# Patient Record
Sex: Female | Born: 1980 | Race: White | Hispanic: No | Marital: Single | Smoking: Current every day smoker
Health system: Southern US, Community
[De-identification: ages and names within clinical notes are randomized; demographics above are authoritative.]

## PROBLEM LIST (undated history)

## (undated) DIAGNOSIS — F988 Other specified behavioral and emotional disorders with onset usually occurring in childhood and adolescence: Secondary | ICD-10-CM

## (undated) HISTORY — PX: NO PAST SURGERIES: SHX2092

---

## 2001-10-24 ENCOUNTER — Other Ambulatory Visit: Admission: RE | Admit: 2001-10-24 | Discharge: 2001-10-24 | Payer: Self-pay | Admitting: *Deleted

## 2002-11-20 ENCOUNTER — Other Ambulatory Visit: Admission: RE | Admit: 2002-11-20 | Discharge: 2002-11-20 | Payer: Self-pay | Admitting: Obstetrics and Gynecology

## 2003-12-18 ENCOUNTER — Other Ambulatory Visit: Admission: RE | Admit: 2003-12-18 | Discharge: 2003-12-18 | Payer: Self-pay | Admitting: Obstetrics and Gynecology

## 2004-05-22 ENCOUNTER — Ambulatory Visit: Payer: Self-pay | Admitting: Pediatrics

## 2005-01-07 ENCOUNTER — Other Ambulatory Visit: Admission: RE | Admit: 2005-01-07 | Discharge: 2005-01-07 | Payer: Self-pay | Admitting: Obstetrics and Gynecology

## 2014-03-22 NOTE — L&D Delivery Note (Signed)
Patient was C/C/+2 and pushed for approx 60 minutes with epidural.   NSVD female infant, Apgars 8/9, weight pending.   The patient had a left vaginal laceration reparied with 2-0 vicryl rapide and a Left labial evulsion repaired with 2-0 vicryl rapide. Fundus was firm. EBL was expected amount. Placenta was delivered intact. Vagina was clear.  Baby was vigorous and doing skin to skin with mother.  Philip Aspen

## 2014-05-29 LAB — OB RESULTS CONSOLE ABO/RH: RH Type: POSITIVE

## 2014-05-29 LAB — OB RESULTS CONSOLE GC/CHLAMYDIA
Chlamydia: NEGATIVE
GC PROBE AMP, GENITAL: NEGATIVE

## 2014-05-29 LAB — OB RESULTS CONSOLE RUBELLA ANTIBODY, IGM: RUBELLA: IMMUNE

## 2014-05-29 LAB — OB RESULTS CONSOLE HEPATITIS B SURFACE ANTIGEN: Hepatitis B Surface Ag: NEGATIVE

## 2014-05-29 LAB — OB RESULTS CONSOLE RPR: RPR: NONREACTIVE

## 2014-05-29 LAB — OB RESULTS CONSOLE HIV ANTIBODY (ROUTINE TESTING): HIV: NONREACTIVE

## 2014-07-14 ENCOUNTER — Emergency Department
Admit: 2014-07-14 | Disposition: A | Discharge status: Home / Self Care | Payer: Self-pay | Admitting: Physician Assistant

## 2014-07-24 ENCOUNTER — Telehealth: Payer: Self-pay | Admitting: Emergency Medicine

## 2014-07-24 NOTE — ED Notes (Signed)
Company rep from [employer] called asking for clarification of return to work note.  There is no time limit for no use of injured limb.  i told her that it would be 5 days restriction and the patient should be on crutches.  She says the patient was not on crutches when she came to office.  i explained that the provider is not here right now. She says she is going to have worker comp ins investigate and will send the employee home.

## 2014-11-15 LAB — OB RESULTS CONSOLE GBS: STREP GROUP B AG: POSITIVE

## 2014-12-06 ENCOUNTER — Inpatient Hospital Stay (HOSPITAL_COMMUNITY): Payer: No Typology Code available for payment source | Admitting: Anesthesiology

## 2014-12-06 ENCOUNTER — Inpatient Hospital Stay (HOSPITAL_COMMUNITY)
Admission: AD | Admit: 2014-12-06 | Discharge: 2014-12-09 | DRG: 774 | Disposition: A | Discharge status: Home / Self Care | Payer: No Typology Code available for payment source | Source: Ambulatory Visit | Attending: Obstetrics and Gynecology | Admitting: Obstetrics and Gynecology

## 2014-12-06 ENCOUNTER — Encounter (HOSPITAL_COMMUNITY): Payer: Self-pay | Admitting: *Deleted

## 2014-12-06 DIAGNOSIS — Z6832 Body mass index (BMI) 32.0-32.9, adult: Secondary | ICD-10-CM

## 2014-12-06 DIAGNOSIS — O1493 Unspecified pre-eclampsia, third trimester: Principal | ICD-10-CM | POA: Diagnosis present

## 2014-12-06 DIAGNOSIS — O1092 Unspecified pre-existing hypertension complicating childbirth: Secondary | ICD-10-CM | POA: Diagnosis present

## 2014-12-06 DIAGNOSIS — E669 Obesity, unspecified: Secondary | ICD-10-CM | POA: Diagnosis present

## 2014-12-06 DIAGNOSIS — O99214 Obesity complicating childbirth: Secondary | ICD-10-CM | POA: Diagnosis present

## 2014-12-06 DIAGNOSIS — Z3A39 39 weeks gestation of pregnancy: Secondary | ICD-10-CM | POA: Diagnosis not present

## 2014-12-06 HISTORY — DX: Other specified behavioral and emotional disorders with onset usually occurring in childhood and adolescence: F98.8

## 2014-12-06 LAB — COMPREHENSIVE METABOLIC PANEL
ALK PHOS: 146 U/L — AB (ref 38–126)
ALT: 17 U/L (ref 14–54)
AST: 21 U/L (ref 15–41)
Albumin: 2.9 g/dL — ABNORMAL LOW (ref 3.5–5.0)
Anion gap: 10 (ref 5–15)
BUN: 15 mg/dL (ref 6–20)
CHLORIDE: 105 mmol/L (ref 101–111)
CO2: 21 mmol/L — AB (ref 22–32)
CREATININE: 1.07 mg/dL — AB (ref 0.44–1.00)
Calcium: 9.5 mg/dL (ref 8.9–10.3)
GFR calc Af Amer: >60 mL/min (ref >60)
Glucose, Bld: 107 mg/dL — ABNORMAL HIGH (ref 65–99)
Potassium: 3.6 mmol/L (ref 3.5–5.1)
SODIUM: 136 mmol/L (ref 135–145)
Total Bilirubin: 0.4 mg/dL (ref 0.3–1.2)
Total Protein: 6.4 g/dL — ABNORMAL LOW (ref 6.5–8.1)

## 2014-12-06 LAB — CBC
HCT: 33.2 % — ABNORMAL LOW (ref 36.0–46.0)
HEMATOCRIT: 30 % — AB (ref 36.0–46.0)
Hemoglobin: 11.2 g/dL — ABNORMAL LOW (ref 12.0–15.0)
Hemoglobin: 10.1 g/dL — ABNORMAL LOW (ref 12.0–15.0)
MCH: 30 pg (ref 26.0–34.0)
MCH: 30.1 pg (ref 26.0–34.0)
MCHC: 33.7 g/dL (ref 30.0–36.0)
MCHC: 33.7 g/dL (ref 30.0–36.0)
MCV: 89 fL (ref 78.0–100.0)
MCV: 89.6 fL (ref 78.0–100.0)
PLATELETS: 276 10*3/uL (ref 150–400)
Platelets: 230 10*3/uL (ref 150–400)
RBC: 3.73 MIL/uL — AB (ref 3.87–5.11)
RBC: 3.35 MIL/uL — AB (ref 3.87–5.11)
RDW: 14.3 % (ref 11.5–15.5)
RDW: 14.8 % (ref 11.5–15.5)
WBC: 17 10*3/uL — ABNORMAL HIGH (ref 4.0–10.5)
WBC: 21.7 10*3/uL — ABNORMAL HIGH (ref 4.0–10.5)

## 2014-12-06 LAB — MAGNESIUM
MAGNESIUM: 5.5 mg/dL — AB (ref 1.7–2.4)
MAGNESIUM: 6.4 mg/dL — AB (ref 1.7–2.4)
Magnesium: 1.6 mg/dL — ABNORMAL LOW (ref 1.7–2.4)
Magnesium: 3.8 mg/dL — ABNORMAL HIGH (ref 1.7–2.4)

## 2014-12-06 LAB — TYPE AND SCREEN
ABO/RH(D): B POS
Antibody Screen: NEGATIVE

## 2014-12-06 LAB — RPR: RPR Ser Ql: NONREACTIVE

## 2014-12-06 LAB — PROTEIN / CREATININE RATIO, URINE
Creatinine, Urine: 68 mg/dL
PROTEIN CREATININE RATIO: 0.29 mg/mg{creat} — AB (ref 0.00–0.15)
Total Protein, Urine: 20 mg/dL

## 2014-12-06 LAB — URIC ACID: Uric Acid, Serum: 5.3 mg/dL (ref 2.3–6.6)

## 2014-12-06 LAB — ABO/RH: ABO/RH(D): B POS

## 2014-12-06 LAB — LACTATE DEHYDROGENASE: LDH: 157 U/L (ref 98–192)

## 2014-12-06 MED ORDER — OXYCODONE-ACETAMINOPHEN 5-325 MG PO TABS: 2.0000 | ORAL_TABLET | ORAL | Status: DC | PRN

## 2014-12-06 MED ORDER — PROMETHAZINE HCL 25 MG PO TABS
25.0000 mg | ORAL_TABLET | Freq: Once | ORAL | Status: AC
  Administered 2014-12-06: 25 mg via ORAL
  Filled 2014-12-06: qty 1

## 2014-12-06 MED ORDER — IBUPROFEN 600 MG PO TABS
600.0000 mg | ORAL_TABLET | Freq: Four times a day (QID) | ORAL | Status: DC
  Administered 2014-12-06 – 2014-12-09 (×12): 600 mg via ORAL
  Filled 2014-12-06 (×12): qty 1

## 2014-12-06 MED ORDER — LACTATED RINGERS IV SOLN
INTRAVENOUS | Status: DC
  Administered 2014-12-06 – 2014-12-07 (×4): via INTRAVENOUS

## 2014-12-06 MED ORDER — LIDOCAINE HCL (PF) 1 % IJ SOLN
INTRAMUSCULAR | Status: DC | PRN
  Administered 2014-12-06 (×2): 4 mL via EPIDURAL
  Administered 2014-12-06: 2 mL via EPIDURAL

## 2014-12-06 MED ORDER — LABETALOL HCL 5 MG/ML IV SOLN
20.0000 mg | INTRAVENOUS | Status: DC | PRN
  Administered 2014-12-06: 40 mg via INTRAVENOUS
  Administered 2014-12-06: 20 mg via INTRAVENOUS
  Filled 2014-12-06: qty 4
  Filled 2014-12-06: qty 16
  Filled 2014-12-06: qty 8

## 2014-12-06 MED ORDER — OXYCODONE-ACETAMINOPHEN 5-325 MG PO TABS: 1.0000 | ORAL_TABLET | ORAL | Status: DC | PRN

## 2014-12-06 MED ORDER — FLEET ENEMA 7-19 GM/118ML RE ENEM: 1.0000 | ENEMA | RECTAL | Status: DC | PRN

## 2014-12-06 MED ORDER — LACTATED RINGERS IV SOLN
INTRAVENOUS | Status: DC
  Administered 2014-12-06: 12:00:00 via INTRAUTERINE

## 2014-12-06 MED ORDER — LIDOCAINE HCL (PF) 1 % IJ SOLN: 30.0000 mL | INTRAMUSCULAR | Status: DC | PRN

## 2014-12-06 MED ORDER — TERBUTALINE SULFATE 1 MG/ML IJ SOLN: 0.2500 mg | Freq: Once | INTRAMUSCULAR | Status: DC | PRN

## 2014-12-06 MED ORDER — CITRIC ACID-SODIUM CITRATE 334-500 MG/5ML PO SOLN
30.0000 mL | ORAL | Status: DC | PRN
  Administered 2014-12-06: 30 mL via ORAL
  Filled 2014-12-06 (×2): qty 15

## 2014-12-06 MED ORDER — ONDANSETRON HCL 4 MG/2ML IJ SOLN: 4.0000 mg | Freq: Four times a day (QID) | INTRAMUSCULAR | Status: DC | PRN

## 2014-12-06 MED ORDER — DIPHENHYDRAMINE HCL 50 MG/ML IJ SOLN: 12.5000 mg | INTRAMUSCULAR | Status: DC | PRN

## 2014-12-06 MED ORDER — DEXTROSE 5 % IV SOLN
5.0000 10*6.[IU] | Freq: Once | INTRAVENOUS | Status: AC
  Administered 2014-12-06: 5 10*6.[IU] via INTRAVENOUS
  Filled 2014-12-06: qty 5

## 2014-12-06 MED ORDER — PENICILLIN G POTASSIUM 5000000 UNITS IJ SOLR
2.5000 10*6.[IU] | INTRAVENOUS | Status: DC
  Administered 2014-12-06 (×3): 2.5 10*6.[IU] via INTRAVENOUS
  Filled 2014-12-06 (×9): qty 2.5

## 2014-12-06 MED ORDER — HYDRALAZINE HCL 20 MG/ML IJ SOLN: 10.0000 mg | Freq: Once | INTRAMUSCULAR | Status: DC | PRN

## 2014-12-06 MED ORDER — BUTORPHANOL TARTRATE 1 MG/ML IJ SOLN
1.0000 mg | INTRAMUSCULAR | Status: DC | PRN
  Administered 2014-12-06: 1 mg via INTRAVENOUS
  Filled 2014-12-06 (×2): qty 1

## 2014-12-06 MED ORDER — OXYTOCIN 40 UNITS IN LACTATED RINGERS INFUSION - SIMPLE MED
1.0000 m[IU]/min | INTRAVENOUS | Status: DC
  Administered 2014-12-06: 2 m[IU]/min via INTRAVENOUS

## 2014-12-06 MED ORDER — FENTANYL 2.5 MCG/ML BUPIVACAINE 1/10 % EPIDURAL INFUSION (WH - ANES)
14.0000 mL/h | INTRAMUSCULAR | Status: DC | PRN
  Administered 2014-12-06 (×2): 14 mL/h via EPIDURAL
  Filled 2014-12-06 (×2): qty 125

## 2014-12-06 MED ORDER — PHENYLEPHRINE 40 MCG/ML (10ML) SYRINGE FOR IV PUSH (FOR BLOOD PRESSURE SUPPORT)
80.0000 ug | PREFILLED_SYRINGE | INTRAVENOUS | Status: DC | PRN
  Filled 2014-12-06: qty 20

## 2014-12-06 MED ORDER — MAGNESIUM SULFATE BOLUS VIA INFUSION
4.0000 g | Freq: Once | INTRAVENOUS | Status: AC
  Administered 2014-12-06: 4 g via INTRAVENOUS
  Filled 2014-12-06: qty 500

## 2014-12-06 MED ORDER — MAGNESIUM SULFATE 50 % IJ SOLN
2.0000 g/h | INTRAVENOUS | Status: DC
  Administered 2014-12-06 (×2): 2 g/h via INTRAVENOUS
  Filled 2014-12-06 (×2): qty 80

## 2014-12-06 MED ORDER — EPHEDRINE 5 MG/ML INJ: 10.0000 mg | INTRAVENOUS | Status: DC | PRN

## 2014-12-06 MED ORDER — ACETAMINOPHEN 325 MG PO TABS
650.0000 mg | ORAL_TABLET | ORAL | Status: DC | PRN
  Administered 2014-12-06: 650 mg via ORAL
  Filled 2014-12-06: qty 2

## 2014-12-06 MED ORDER — OXYTOCIN 40 UNITS IN LACTATED RINGERS INFUSION - SIMPLE MED
62.5000 mL/h | INTRAVENOUS | Status: DC
  Filled 2014-12-06: qty 1000

## 2014-12-06 MED ORDER — OXYTOCIN BOLUS FROM INFUSION
500.0000 mL | INTRAVENOUS | Status: DC
  Administered 2014-12-06: 500 mL via INTRAVENOUS

## 2014-12-06 MED ORDER — LACTATED RINGERS IV SOLN
500.0000 mL | INTRAVENOUS | Status: DC | PRN
  Administered 2014-12-06 (×2): 500 mL via INTRAVENOUS

## 2014-12-06 NOTE — Progress Notes (Signed)
Called back regarding pt's continued elevated BP readings after pain medication administration and requesting labetelol protocol. Will put in orders

## 2014-12-06 NOTE — MAU Note (Signed)
Pt reports irregular contractions and vaginal bleeding. Was seen in the office today and was 2 cm

## 2014-12-06 NOTE — Anesthesia Preprocedure Evaluation (Signed)
Anesthesia Evaluation  Patient identified by MRN, date of birth, ID band Patient awake    Reviewed: Allergy & Precautions, H&P , Patient's Chart, lab work & pertinent test results  Airway Mallampati: II  TM Distance: >3 FB Neck ROM: full    Dental no notable dental hx.    Pulmonary neg pulmonary ROS,    Pulmonary exam normal breath sounds clear to auscultation       Cardiovascular negative cardio ROS Normal cardiovascular exam Rhythm:regular Rate:Normal     Neuro/Psych negative neurological ROS  negative psych ROS   GI/Hepatic negative GI ROS, Neg liver ROS,   Endo/Other  negative endocrine ROS  Renal/GU negative Renal ROS  negative genitourinary   Musculoskeletal   Abdominal (+) + obese,   Peds  Hematology negative hematology ROS (+)   Anesthesia Other Findings Pregnancy - induction for pre-eclampsia, elevated blood pressures, on magnesium Platelets and allergies reviewed Denies active cardiac or pulmonary symptoms, METS > 4  Denies blood thinning medications, bleeding disorders, asthma, supine hypotension syndrome, previous anesthesia difficulties    Reproductive/Obstetrics (+) Pregnancy                             Anesthesia Physical Anesthesia Plan  ASA: III  Anesthesia Plan: Epidural   Post-op Pain Management:    Induction:   Airway Management Planned:   Additional Equipment:   Intra-op Plan:   Post-operative Plan:   Informed Consent: I have reviewed the patients History and Physical, chart, labs and discussed the procedure including the risks, benefits and alternatives for the proposed anesthesia with the patient or authorized representative who has indicated his/her understanding and acceptance.     Plan Discussed with:   Anesthesia Plan Comments:         Anesthesia Quick Evaluation

## 2014-12-06 NOTE — Anesthesia Procedure Notes (Signed)
Epidural Patient location during procedure: OB  Staffing Anesthesiologist: JUDD, BENJAMIN Performed by: anesthesiologist   Preanesthetic Checklist Completed: patient identified, site marked, surgical consent, pre-op evaluation, timeout performed, IV checked, risks and benefits discussed and monitors and equipment checked  Epidural Patient position: sitting Prep: site prepped and draped and DuraPrep Patient monitoring: continuous pulse ox and blood pressure Approach: midline Location: L3-L4 Injection technique: LOR saline  Needle:  Needle type: Tuohy  Needle gauge: 17 G Needle length: 9 cm and 9 Needle insertion depth: 5 cm cm Catheter type: closed end flexible Catheter size: 19 Gauge Catheter at skin depth: 10 cm Test dose: negative  Assessment Events: blood not aspirated, injection not painful, no injection resistance, negative IV test and no paresthesia  Additional Notes Patient identified. Risks/Benefits/Options discussed with patient including but not limited to bleeding, infection, nerve damage, paralysis, failed block, incomplete pain control, headache, blood pressure changes, nausea, vomiting, reactions to medications, itching and postpartum back pain. Confirmed with bedside nurse the patient's most recent platelet count. Confirmed with patient that they are not currently taking any anticoagulation, have any bleeding history or any family history of bleeding disorders. Patient expressed understanding and wished to proceed. All questions were answered. Sterile technique was used throughout the entire procedure. Please see nursing notes for vital signs. Test dose was given through epidural catheter and negative prior to continuing to dose epidural or start infusion. Warning signs of high block given to the patient including shortness of breath, tingling/numbness in hands, complete motor block, or any concerning symptoms with instructions to call for help. Patient was given  instructions on fall risk and not to get out of bed. All questions and concerns addressed with instructions to call with any issues or inadequate analgesia.  Reason for block:procedure for pain   

## 2014-12-06 NOTE — H&P (Signed)
34 y.o. [redacted]w[redacted]d  G1P0 comes in c/o labor bur found to have severe range BPs (160s/11s) and Cr of 1.07.  Otherwise has good fetal movement.  She is having some vaginal bleeding but not brisk.  Denies other symptoms of severe and all other PIH labs normal.  Unable to get clean urine secondary bleeding.  Pt had routine PN appt today and had a normal BP.  She had c/o of slight leaking fluid the day before but she was negative for fern, pool and valsalva.  She stated that after the appt at 5pm she had leaking of fluid again.  History reviewed. No pertinent past medical history. History reviewed. No pertinent past surgical history.  OB History  Gravida Para Term Preterm AB SAB TAB Ectopic Multiple Living  1             # Outcome Date GA Lbr Len/2nd Weight Sex Delivery Anes PTL Lv  1 Current               Social History   Social History  . Marital Status: Single    Spouse Name: N/A  . Number of Children: N/A  . Years of Education: N/A   Occupational History  . Not on file.   Social History Main Topics  . Smoking status: Never Smoker   . Smokeless tobacco: Not on file  . Alcohol Use: No  . Drug Use: No  . Sexual Activity: Not Currently   Other Topics Concern  . Not on file   Social History Narrative  . No narrative on file   Review of patient's allergies indicates no known allergies.    Prenatal Transfer Tool  Maternal Diabetes: No Genetic Screening: Normal Maternal Ultrasounds/Referrals: Normal Fetal Ultrasounds or other Referrals:  None Maternal Substance Abuse:  No Significant Maternal Medications:  None Significant Maternal Lab Results: None  Other PNC: uncomplicated.    Filed Vitals:   12/06/14 0241 12/06/14 0245 12/06/14 0310 12/06/14 0312  BP: 158/94 144/96 161/94   Pulse: 87 84 86   Temp:    98.1 F (36.7 C)  TempSrc:    Oral  Resp:      Height:      Weight:      SpO2:         Lungs/Cor:  NAD Abdomen:  soft, gravid Ex:  no cords, erythema SVE:   4/80/-2, no membranes felt and IUPC place.  Very scant fluid.  Small amount of bright red blood from vagina but not active. FHTs:  140s, good STV, NST R Toco:  q 5   A/P   G1P0 [redacted]w[redacted]d with early labor and severe preeclampsia by blood pressure and Cr criteria.    GBS pos. 1.  Pt is receiving HTN protocol- on second dose of labetalol IV.   2.  Will start magnesium sulfate 4 gm load and 2 gm per hour.  Will check magnesium levels to ensure she is clearing secondary mild kidney dysfunction. 3. Pt progressing on own right now- hold pit.   HORVATH,MICHELLE A

## 2014-12-06 NOTE — Progress Notes (Addendum)
Called back regarding pt elevated BP and discomfort level. Will admit pt to BS and order PIH labs. Requested IV pain medication for patient and BP medication. Will recheck BP after patient is comfortable and call back

## 2014-12-06 NOTE — Progress Notes (Signed)
Pt complaining of increased pressure and requesting to be rechecked.

## 2014-12-06 NOTE — Progress Notes (Signed)
CTSP  Pt now comfortable with epidural.  Called for late decels.    Filed Vitals:   12/06/14 0601 12/06/14 0622 12/06/14 0631 12/06/14 0700  BP: 116/74  116/65   Pulse: 85  104   Temp:  98.4 F (36.9 C)    TempSrc:  Oral    Resp: Height:      Weight:      SpO2:       FHTs 140-150 with gSTV but not reactive from 0545; one late appearing variable at 0545 and then late decels with alternating contractions.  At 0645 I checked her : SVE 6/90/-1, small amt of blood but not active; only dried blood on perineum before. + Scalp stim  FHTs now 140s, gSTV and NST R Toco q 7 minutes  Will continue for now.  Hold pit for now secondary progress.

## 2014-12-06 NOTE — Progress Notes (Signed)
Spoke with RN re: tracing.  Min to mod variability, no accels, early variable decels.  Will start amnioinfusion and continue pitocin.  Cat 2 strip, contractions not adequate.  Continue pitocin for augmentation.  Advised Rn to call with any late decels

## 2014-12-07 LAB — MAGNESIUM
MAGNESIUM: 7.1 mg/dL — AB (ref 1.7–2.4)
MAGNESIUM: 6 mg/dL — AB (ref 1.7–2.4)
Magnesium: 6.6 mg/dL (ref 1.7–2.4)
Magnesium: 6.9 mg/dL (ref 1.7–2.4)

## 2014-12-07 LAB — CBC
HCT: 29.6 % — ABNORMAL LOW (ref 36.0–46.0)
Hemoglobin: 9.8 g/dL — ABNORMAL LOW (ref 12.0–15.0)
MCH: 29.8 pg (ref 26.0–34.0)
MCHC: 33.1 g/dL (ref 30.0–36.0)
MCV: 90 fL (ref 78.0–100.0)
Platelets: 231 10*3/uL (ref 150–400)
RBC: 3.29 MIL/uL — ABNORMAL LOW (ref 3.87–5.11)
RDW: 15.1 % (ref 11.5–15.5)
WBC: 17.4 10*3/uL — ABNORMAL HIGH (ref 4.0–10.5)

## 2014-12-07 LAB — COMPREHENSIVE METABOLIC PANEL
ALBUMIN: 2.3 g/dL — AB (ref 3.5–5.0)
ALT: 16 U/L (ref 14–54)
ANION GAP: 11 (ref 5–15)
AST: 29 U/L (ref 15–41)
Alkaline Phosphatase: 107 U/L (ref 38–126)
BILIRUBIN TOTAL: 0.3 mg/dL (ref 0.3–1.2)
BUN: 9 mg/dL (ref 6–20)
CO2: 21 mmol/L — AB (ref 22–32)
Calcium: 6.4 mg/dL — CL (ref 8.9–10.3)
Chloride: 103 mmol/L (ref 101–111)
Creatinine, Ser: 1.06 mg/dL — ABNORMAL HIGH (ref 0.44–1.00)
GFR calc non Af Amer: >60 mL/min (ref >60)
GLUCOSE: 118 mg/dL — AB (ref 65–99)
POTASSIUM: 3.1 mmol/L — AB (ref 3.5–5.1)
SODIUM: 135 mmol/L (ref 135–145)
TOTAL PROTEIN: 5.8 g/dL — AB (ref 6.5–8.1)

## 2014-12-07 MED ORDER — LACTATED RINGERS IV SOLN
INTRAVENOUS | Status: DC
  Administered 2014-12-07: 15:00:00 via INTRAVENOUS

## 2014-12-07 MED ORDER — LANOLIN HYDROUS EX OINT: TOPICAL_OINTMENT | CUTANEOUS | Status: DC | PRN

## 2014-12-07 MED ORDER — ACETAMINOPHEN 325 MG PO TABS: 650.0000 mg | ORAL_TABLET | ORAL | Status: DC | PRN

## 2014-12-07 MED ORDER — ZOLPIDEM TARTRATE 5 MG PO TABS: 5.0000 mg | ORAL_TABLET | Freq: Every evening | ORAL | Status: DC | PRN

## 2014-12-07 MED ORDER — PRENATAL MULTIVITAMIN CH
1.0000 | ORAL_TABLET | Freq: Every day | ORAL | Status: DC
  Filled 2014-12-07 (×3): qty 1

## 2014-12-07 MED ORDER — ONDANSETRON HCL 4 MG PO TABS
4.0000 mg | ORAL_TABLET | ORAL | Status: DC | PRN
Start: 2014-12-07 — End: 2014-12-09

## 2014-12-07 MED ORDER — DIPHENHYDRAMINE HCL 25 MG PO CAPS: 25.0000 mg | ORAL_CAPSULE | Freq: Four times a day (QID) | ORAL | Status: DC | PRN

## 2014-12-07 MED ORDER — WITCH HAZEL-GLYCERIN EX PADS: 1.0000 "application " | MEDICATED_PAD | CUTANEOUS | Status: DC | PRN

## 2014-12-07 MED ORDER — BENZOCAINE-MENTHOL 20-0.5 % EX AERO
1.0000 "application " | INHALATION_SPRAY | CUTANEOUS | Status: DC | PRN
  Filled 2014-12-07: qty 56

## 2014-12-07 MED ORDER — DIBUCAINE 1 % RE OINT
1.0000 "application " | TOPICAL_OINTMENT | RECTAL | Status: DC | PRN
  Filled 2014-12-07: qty 28

## 2014-12-07 MED ORDER — PRENATAL MULTIVITAMIN CH
1.0000 | ORAL_TABLET | Freq: Every day | ORAL | Status: DC
  Administered 2014-12-07 – 2014-12-09 (×3): 1 via ORAL

## 2014-12-07 MED ORDER — OXYCODONE-ACETAMINOPHEN 5-325 MG PO TABS: 1.0000 | ORAL_TABLET | ORAL | Status: DC | PRN

## 2014-12-07 MED ORDER — TETANUS-DIPHTH-ACELL PERTUSSIS 5-2.5-18.5 LF-MCG/0.5 IM SUSP
0.5000 mL | Freq: Once | INTRAMUSCULAR | Status: DC
  Filled 2014-12-07: qty 0.5

## 2014-12-07 MED ORDER — OXYCODONE-ACETAMINOPHEN 5-325 MG PO TABS: 2.0000 | ORAL_TABLET | ORAL | Status: DC | PRN

## 2014-12-07 MED ORDER — SIMETHICONE 80 MG PO CHEW: 80.0000 mg | CHEWABLE_TABLET | ORAL | Status: DC | PRN

## 2014-12-07 MED ORDER — ONDANSETRON HCL 4 MG/2ML IJ SOLN: 4.0000 mg | INTRAMUSCULAR | Status: DC | PRN

## 2014-12-07 MED ORDER — SENNOSIDES-DOCUSATE SODIUM 8.6-50 MG PO TABS
2.0000 | ORAL_TABLET | ORAL | Status: DC
  Administered 2014-12-07 – 2014-12-09 (×3): 2 via ORAL
  Filled 2014-12-07 (×4): qty 2

## 2014-12-07 NOTE — Progress Notes (Signed)
Notified by L&D RR OB RN that patient had a critical Mag level of 7.1.  Dr. Claiborne Billings gave order to notify only if level was greater than 8.5.

## 2014-12-07 NOTE — Progress Notes (Signed)
CRITICAL VALUE ALERT  Critical value received:  Calcium 6.4  Date of notification:  12/07/2014  Time of notification:    Critical value read back:Yes.   Received by L&D Charge Nurse  Nurse who received alert:  L&D Berton Bon  MD notified (1st page):  Dr. Claiborne Billings  Time of first page:  1121  MD notified (2nd page):  Time of second page:    Responding MD:  Dr. Claiborne Billings  Time MD responded:  1121  Dr. Claiborne Billings stated on way and will review and place orders.  1210 - Dr. Claiborne Billings on unit in delivery.  No new orders at this stated.  Stated will address.  1257 - Dr. Claiborne Billings at bedside to see patient.  Gave orders for Magnesium to be discontinued at 1630.

## 2014-12-07 NOTE — Progress Notes (Signed)
PPD #1 Patient is eating, ambulating, voiding.  Pain control is good.  Appropriate lochia, no complaints.  Mild HA- just took tylenol.  No RUQ pain or vision changes.  No CP/SOB  Filed Vitals:   12/07/14 0300 12/07/14 0430 12/07/14 0836 12/07/14 0852  BP:  132/88  153/92  Pulse:  80  82  Temp:  97.6 F (36.4 C) 98.2 F (36.8 C)   TempSrc:  Oral Oral   Resp: Height:      Weight:      SpO2:    92%    Fundus firm Perineum without swelling. No CT   Lab Results  Component Value Date   WBC 21.7* 12/06/2014   HGB 10.1* 12/06/2014   HCT 30.0* 12/06/2014   MCV 89.6 12/06/2014   PLT 230 12/06/2014    --/--/B POS, B POS (09/16 0125)  A/P Post partum day 1 Elevated Cr: Mag check q 6 hrs, last check this am, therapeutic 6.9, will continue until 24 hrs post delivery.  UOP adequate. CBC/CMP ordered for 5am, drawn late and results pending.  Routine care.   Circ in office.    Kimberly Rowland

## 2014-12-07 NOTE — Progress Notes (Signed)
CRITICAL VALUE ALERT  Critical value received:  Mag 6.9  Date of notification:  12/07/2014  Time of notification:  0808  Critical value read back:Yes.    Nurse who received alert:    MD notified (1st page):  Dr. Claiborne Billings  Time of first page:  [phone]  MD notified (2nd page):  Time of second page:  Responding MD:  Dr. Claiborne Billings  Time MD responded:  [phone]  Notified Dr. Claiborne Billings.  No new orders at this time.

## 2014-12-07 NOTE — Progress Notes (Signed)
Advise by nursing of low Ca2+ at 6.4.  Discussed with her transient hypocalcemia seen with Mg treatment.  Magnesium now discontinued.  Pt was without CP/SOB, or reports of arrhythmia.  Advised will recheck Ca2+ at 10pm and continue to monitor for signs of symptomatic hypocalcemia.

## 2014-12-07 NOTE — Progress Notes (Signed)
   12/07/14 2214  Vitals  BP (!) 151/87 mmHg  MAP (mmHg) 103  BP Location Left Arm  BP Method Automatic  Patient Position (if appropriate) Lying  Pulse Rate 74  Pulse Rate Source Dinamap  PCA/Epidural/Spinal Assessment  Respiratory Pattern Regular;Unlabored;Symmetrical;No dyspnea  Oxygen Therapy  SpO2 98 %  O2 Device Room Air  MD was made aware.

## 2014-12-07 NOTE — Progress Notes (Signed)
CRITICAL VALUE ALERT  Critical value received:  Magnesium level  Date of notification:  12/07/14  Time of notification: 0002   Critical value read back:Yes.    Nurse who received alert:  Sunday Spillers, RN  MD notified (1st page):  Claiborne Billings  Time of first page:  0012  MD notified (2nd page):  Time of second page:  Responding MD:  Claiborne Billings  Time MD responded:  [phone]

## 2014-12-07 NOTE — Lactation Note (Signed)
This note was copied from the chart of Boy Helen Winterhalter. Lactation Consultation Note  Patient Name: Boy Tylynn Braniff ZOXWR'U Date: 12/07/2014 Reason for consult: Initial assessment  Assisted Mom with positioning and latch. Basic teaching reviewed with parents. Lactation brochure left for review, advised of OP services and support group. Encouraged to call for questions/concerns.  Maternal Data Has patient been taught Hand Expression?: Yes Does the patient have breastfeeding experience prior to this delivery?: No  Feeding Feeding Type: Breast Fed  LATCH Score/Interventions Latch: Repeated attempts needed to sustain latch, nipple held in mouth throughout feeding, stimulation needed to elicit sucking reflex. Intervention(s): Adjust position;Assist with latch;Breast massage;Breast compression  Audible Swallowing: A few with stimulation  Type of Nipple: Everted at rest and after stimulation  Comfort (Breast/Nipple): Soft / non-tender     Hold (Positioning): Assistance needed to correctly position infant at breast and maintain latch. Intervention(s): Breastfeeding basics reviewed;Support Pillows;Position options;Skin to skin  LATCH Score: 7  Lactation Tools Discussed/Used     Consult Status Consult Status: Follow-up Date: 12/08/14 Follow-up type: In-patient    Alfred Levins 12/07/2014, 4:33 PM

## 2014-12-08 LAB — CBC
HCT: 27.1 % — ABNORMAL LOW (ref 36.0–46.0)
HEMOGLOBIN: 8.8 g/dL — AB (ref 12.0–15.0)
MCH: 29.6 pg (ref 26.0–34.0)
MCHC: 32.5 g/dL (ref 30.0–36.0)
MCV: 91.2 fL (ref 78.0–100.0)
Platelets: 239 10*3/uL (ref 150–400)
RBC: 2.97 MIL/uL — ABNORMAL LOW (ref 3.87–5.11)
RDW: 15.4 % (ref 11.5–15.5)
WBC: 12.9 10*3/uL — ABNORMAL HIGH (ref 4.0–10.5)

## 2014-12-08 LAB — COMPREHENSIVE METABOLIC PANEL
ALK PHOS: 92 U/L (ref 38–126)
ALK PHOS: 98 U/L (ref 38–126)
ALT: 15 U/L (ref 14–54)
ALT: 16 U/L (ref 14–54)
ANION GAP: 6 (ref 5–15)
ANION GAP: 5 (ref 5–15)
AST: 23 U/L (ref 15–41)
AST: 24 U/L (ref 15–41)
Albumin: 2.1 g/dL — ABNORMAL LOW (ref 3.5–5.0)
Albumin: 2.1 g/dL — ABNORMAL LOW (ref 3.5–5.0)
BILIRUBIN TOTAL: 0.3 mg/dL (ref 0.3–1.2)
BILIRUBIN TOTAL: 0.2 mg/dL — AB (ref 0.3–1.2)
BUN: 15 mg/dL (ref 6–20)
BUN: 16 mg/dL (ref 6–20)
CALCIUM: 6.8 mg/dL — AB (ref 8.9–10.3)
CALCIUM: 6.6 mg/dL — AB (ref 8.9–10.3)
CO2: 23 mmol/L (ref 22–32)
CO2: 24 mmol/L (ref 22–32)
Chloride: 108 mmol/L (ref 101–111)
Chloride: 108 mmol/L (ref 101–111)
Creatinine, Ser: 1.03 mg/dL — ABNORMAL HIGH (ref 0.44–1.00)
Creatinine, Ser: 1.15 mg/dL — ABNORMAL HIGH (ref 0.44–1.00)
GFR calc non Af Amer: >60 mL/min (ref >60)
GLUCOSE: 113 mg/dL — AB (ref 65–99)
Glucose, Bld: 83 mg/dL (ref 65–99)
POTASSIUM: 3.4 mmol/L — AB (ref 3.5–5.1)
POTASSIUM: 3.3 mmol/L — AB (ref 3.5–5.1)
SODIUM: 137 mmol/L (ref 135–145)
Sodium: 137 mmol/L (ref 135–145)
TOTAL PROTEIN: 5.3 g/dL — AB (ref 6.5–8.1)
TOTAL PROTEIN: 5.3 g/dL — AB (ref 6.5–8.1)

## 2014-12-08 LAB — MAGNESIUM
MAGNESIUM: 2.1 mg/dL (ref 1.7–2.4)
MAGNESIUM: 2.4 mg/dL (ref 1.7–2.4)
MAGNESIUM: 2.7 mg/dL — AB (ref 1.7–2.4)
MAGNESIUM: 3.1 mg/dL — AB (ref 1.7–2.4)
MAGNESIUM: 4 mg/dL — AB (ref 1.7–2.4)

## 2014-12-08 MED ORDER — POTASSIUM CHLORIDE 10 MEQ/100ML IV SOLN
10.0000 meq | INTRAVENOUS | Status: AC
  Administered 2014-12-08 (×4): 10 meq via INTRAVENOUS
  Filled 2014-12-08 (×4): qty 100

## 2014-12-08 MED ORDER — FERROUS SULFATE 325 (65 FE) MG PO TABS
325.0000 mg | ORAL_TABLET | Freq: Every day | ORAL | Status: DC
  Administered 2014-12-08 – 2014-12-09 (×2): 325 mg via ORAL
  Filled 2014-12-08 (×2): qty 1

## 2014-12-08 NOTE — Anesthesia Postprocedure Evaluation (Signed)
  Anesthesia Post-op Note  Patient: Kimberly Rowland  Procedure(s) Performed: * No procedures listed *  Patient Location: Mother/Baby  Anesthesia Type:Epidural  Level of Consciousness: awake, alert  and oriented  Airway and Oxygen Therapy: Patient Spontanous Breathing  Post-op Pain: none  Post-op Assessment: Post-op Vital signs reviewed and Patient's Cardiovascular Status Stable              Post-op Vital Signs: Reviewed and stable  Last Vitals:  Filed Vitals:   12/08/14 0522  BP: 135/81  Pulse: 78  Temp: 36.7 C  Resp: 16    Complications: No apparent anesthesia complications

## 2014-12-08 NOTE — Progress Notes (Signed)
Potassium IV med is behind schedule d/t patient needing new IV and unable to tolerate rate of 191ml/hr.

## 2014-12-08 NOTE — Addendum Note (Signed)
Addendum  created 12/08/14 0817 by Elgie Congo, CRNA   Modules edited: Notes Section   Notes Section:  File: [id number]

## 2014-12-09 ENCOUNTER — Encounter (HOSPITAL_COMMUNITY): Payer: Self-pay

## 2014-12-09 LAB — COMPREHENSIVE METABOLIC PANEL
ALBUMIN: 2.2 g/dL — AB (ref 3.5–5.0)
ALT: 18 U/L (ref 14–54)
ANION GAP: 9 (ref 5–15)
AST: 26 U/L (ref 15–41)
Alkaline Phosphatase: 88 U/L (ref 38–126)
BUN: 13 mg/dL (ref 6–20)
CHLORIDE: 106 mmol/L (ref 101–111)
CO2: 22 mmol/L (ref 22–32)
Calcium: 7.8 mg/dL — ABNORMAL LOW (ref 8.9–10.3)
Creatinine, Ser: 0.91 mg/dL (ref 0.44–1.00)
GFR calc Af Amer: >60 mL/min (ref >60)
GFR calc non Af Amer: >60 mL/min (ref >60)
GLUCOSE: 89 mg/dL (ref 65–99)
POTASSIUM: 4 mmol/L (ref 3.5–5.1)
SODIUM: 137 mmol/L (ref 135–145)
Total Bilirubin: 0.4 mg/dL (ref 0.3–1.2)
Total Protein: 5.2 g/dL — ABNORMAL LOW (ref 6.5–8.1)

## 2014-12-09 LAB — CBC
HCT: 27.9 % — ABNORMAL LOW (ref 36.0–46.0)
Hemoglobin: 9 g/dL — ABNORMAL LOW (ref 12.0–15.0)
MCH: 29.7 pg (ref 26.0–34.0)
MCHC: 32.3 g/dL (ref 30.0–36.0)
MCV: 92.1 fL (ref 78.0–100.0)
Platelets: 263 10*3/uL (ref 150–400)
RBC: 3.03 MIL/uL — ABNORMAL LOW (ref 3.87–5.11)
RDW: 15.2 % (ref 11.5–15.5)
WBC: 10 10*3/uL (ref 4.0–10.5)

## 2014-12-09 LAB — MAGNESIUM: Magnesium: 1.9 mg/dL (ref 1.7–2.4)

## 2014-12-09 MED ORDER — IBUPROFEN 600 MG PO TABS: 600.0000 mg | ORAL_TABLET | Freq: Four times a day (QID) | ORAL | Status: DC

## 2014-12-09 NOTE — Discharge Instructions (Signed)

## 2014-12-09 NOTE — Discharge Summary (Addendum)
Obstetric Discharge Summary Reason for Admission: induction of labor and preeclampsia, severe Prenatal Procedures: none Intrapartum Procedures: spontaneous vaginal delivery Postpartum Procedures: magnesium x 24 hr Complications-Operative and Postpartum: vaginal and and labial laceration HEMOGLOBIN  Date Value Ref Range Status  12/09/2014 9.0* 12.0 - 15.0 g/dL Final   HCT  Date Value Ref Range Status  12/09/2014 27.9* 36.0 - 46.0 % Final    Physical Exam:  General: alert and cooperative Lochia: appropriate Uterine Fundus: firm DVT Evaluation: No evidence of DVT seen on physical exam.  Discharge Diagnoses: Term Pregnancy-delivered  Discharge Information: Date: 12/09/2014 Activity: pelvic rest Diet: routine Medications: PNV, Ibuprofen and declined percocet Condition: stable Instructions: refer to practice specific booklet Discharge to: home Follow-up Information    Follow up with CALLAHAN, SIDNEY, DO In 1 week.   Specialty:  Obstetrics and Gynecology   Why:  BP check   Contact information:   [address] [place] [address] [phone]       Newborn Data: Live born female  Birth Weight: 7 lb 6 oz (3345 g) APGAR: 8, 9  Home with mother.  Osceola Holian GEFFEL Scottlynn Lindell 12/09/2014, 12:11 PM

## 2014-12-09 NOTE — Lactation Note (Signed)
This note was copied from the chart of Kimberly Rowland. Lactation Consultation Note Follow up visit at 65 hours of age.  Baby has a bili check at 1330 today after photo therapy was d/c'd this am.  Mom reports just finishing another feeding and changed a wet diaper.  15 feedings recorded in past 24 hours with 4 voids and 7 stools.  Discussed progression of milk supply and how to know if baby is breastfeeding well.  Discussed engorgement care.  Discharge questions answered.  MBU Rn to attempt LATCH score before discharge.    Patient Name: Kimberly Rowland ZOXWR'U Date: 12/09/2014 Reason for consult: Follow-up assessment   Maternal Data    Feeding Feeding Type: Breast Fed Length of feed: 20 min  LATCH Score/Interventions                Intervention(s): Breastfeeding basics reviewed     Lactation Tools Discussed/Used     Consult Status Consult Status: Complete Date: 12/09/14    Jannifer Rodney 12/09/2014, 9:57 AM

## 2014-12-09 NOTE — Progress Notes (Signed)
Patient is doing well.  She is ambulating, voiding, tolerating PO.  Pain control is good.  Lochia is appropriate. No HA/BV/RUQ pain.  BPs mainly 130-14/70-80s yesterday, mildly elevated this AM.    Filed Vitals:   12/08/14 0930 12/08/14 1230 12/08/14 1715 12/09/14 0510  BP: 138/77 142/84 139/86 156/83  Pulse: 83 78 73 67  Temp: 98.2 F (36.8 C) 98.1 F (36.7 C)  98.4 F (36.9 C)  TempSrc: Oral Oral  Oral  Resp: Height:      Weight:      SpO2:        NAD Fundus firm Ext: 1+ edema b/l  Lab Results  Component Value Date   WBC 10.0 12/09/2014   HGB 9.0* 12/09/2014   HCT 27.9* 12/09/2014   MCV 92.1 12/09/2014   PLT 263 12/09/2014    --/--/B POS, B POS (09/16 0125)/RImmune  A/P 33 y.o. G1P1001 PPD#3 with severe preeclampsia.  Cr improved to 0.9 from peak 1.15.  BPs moderate range yesterday, mildly up at 150/80 this AM.  Will trend BPs this AM.  If not increasing, will d/c to home with 1 wk BP check in office.   [facility] GEFFEL [employer]

## 2019-06-18 ENCOUNTER — Ambulatory Visit: Payer: PRIVATE HEALTH INSURANCE | Attending: Internal Medicine

## 2019-06-18 DIAGNOSIS — Z23 Encounter for immunization: Secondary | ICD-10-CM

## 2019-06-18 NOTE — Progress Notes (Signed)
   Covid-19 Vaccination Clinic  Name:  Liat Mayol    MRN: [id number] DOB: 15-Jun-1980  06/18/2019  Ms. Griffo was observed post Covid-19 immunization for 15 minutes without incident. She was provided with Vaccine Information Sheet and instruction to access the V-Safe system.   Ms. Arterburn was instructed to call 911 with any severe reactions post vaccine: Marland Kitchen Difficulty breathing  . Swelling of face and throat  . A fast heartbeat  . A bad rash all over body  . Dizziness and weakness   Immunizations Administered    Name Date Dose VIS Date Route   Pfizer COVID-19 Vaccine 06/18/2019  1:37 PM 0.3 mL 03/02/2019 Intramuscular   Manufacturer: [employer], [employer]   Lot: [id number]   NDC: [address]-4

## 2019-07-10 ENCOUNTER — Ambulatory Visit: Payer: PRIVATE HEALTH INSURANCE | Attending: Internal Medicine

## 2019-07-10 DIAGNOSIS — Z23 Encounter for immunization: Secondary | ICD-10-CM

## 2019-07-10 NOTE — Progress Notes (Signed)
   Covid-19 Vaccination Clinic  Name:  Kimberly Rowland    MRN: [id number] DOB: [birth year]  07/10/2019  [name] was observed post Covid-19 immunization for 15 minutes without incident. She was provided with Vaccine Information Sheet and instruction to access the V-Safe system.   [name] was instructed to call 911 with any severe reactions post vaccine: Marland Kitchen Difficulty breathing  . Swelling of face and throat  . A fast heartbeat  . A bad rash all over body  . Dizziness and weakness   Immunizations Administered    Name Date Dose VIS Date Route   Pfizer COVID-19 Vaccine 07/10/2019  2:18 PM 0.3 mL 05/16/2018 Intramuscular   Manufacturer: [employer], [employer]   Lot: [id number]   NDC: [address]-1

## 2020-04-25 ENCOUNTER — Telehealth: Payer: Self-pay | Admitting: *Deleted

## 2020-04-25 NOTE — Telephone Encounter (Signed)
Kimberly Fermo NP first made contact with pt 04/23/20. Pt reported her son had a positive rapid home covid test. His sx started 1/30. He has received 1st covid vaccine on 04/10/20. Pt is asymptomatic, fully vaccinated and boosted. 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  RN spoke with pt this morning and she reports she is still asymptomatic. Son improving, most sx resolved. She did not complete drive thru testing as testing site called her and told her she did not need PCR if home test was positive. She is planning to RTW when her son can return to school. She was told be them that he could return with a negative test. Advised pt this is not CDC recommendations to retest for a negative result as he could test positive for an extended time despite completing full quarantine. Advised her to contact them for clarified instructions and confirmation that he can return on Monday 2/7 as she is planning on. Tentative return for work for pt on next scheduled workday of 2/7 as long as son is cleared to return to school. Pt scheduled for testing on Day 5 exposure 04/26/[address] [facility] at 1100. Pt verbalized knowledge of strict mask use and conditions through 2/9. Will plan f/u over the weekend with pt to confirm RTW.

## 2020-04-26 NOTE — Telephone Encounter (Signed)
Reviewed RN Haley note agreed with plan of care. 

## 2020-04-27 ENCOUNTER — Encounter: Payer: Self-pay | Admitting: *Deleted

## 2020-04-27 DIAGNOSIS — O1490 Unspecified pre-eclampsia, unspecified trimester: Secondary | ICD-10-CM | POA: Insufficient documentation

## 2020-04-27 DIAGNOSIS — Z720 Tobacco use: Secondary | ICD-10-CM | POA: Insufficient documentation

## 2020-04-27 DIAGNOSIS — F988 Other specified behavioral and emotional disorders with onset usually occurring in childhood and adolescence: Secondary | ICD-10-CM | POA: Insufficient documentation

## 2020-04-27 DIAGNOSIS — R030 Elevated blood-pressure reading, without diagnosis of hypertension: Secondary | ICD-10-CM | POA: Insufficient documentation

## 2020-04-27 NOTE — Telephone Encounter (Signed)
Patient contacted via telephone and she reported went for test yesterday still waiting for results.  Son no longer with symptoms and she continues asymptomatic.  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  Spoke full sentences without difficulty, no cough/nasal congestion or throat clearing during 4 minute telephone call.  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  Patient verbalized understanding information/instructions, agreed with plan of care and had no further questions at this time.  HR notified test results pending and cleared to return to work 04/28/20 as asymptomatic and fully covid vaccinated/boosted.

## 2020-04-28 NOTE — Telephone Encounter (Signed)
Pt returned call. Still asymptomatic. No results back yet. Did RTW today as expected. Will f/u tomorrow for results.

## 2020-04-28 NOTE — Telephone Encounter (Signed)
Called to check on test results. No answer. LVMRCB.

## 2020-04-29 NOTE — Telephone Encounter (Signed)
Pt came by clinic reporting that result portal still states Covid test not yet received. Completed testing 2/5. Advised pt to call support number or send email to customer support for further instructions.

## 2020-04-29 NOTE — Telephone Encounter (Signed)
Noted returned to work as expected test results still pending and asymptomatic

## 2020-04-30 NOTE — Telephone Encounter (Signed)
Noted patient to contact testing site as results still pending.

## 2020-05-05 NOTE — Telephone Encounter (Signed)
Late entry: Pt brought test result printout to clinic on Friday 2/11 with negative result. Pt asymptomatic. Feels well. Closing encounter.

## 2020-05-06 NOTE — Telephone Encounter (Signed)
Noted negative covid test asymptomatic

## 2020-07-01 ENCOUNTER — Encounter: Payer: Self-pay | Admitting: *Deleted

## 2020-07-01 ENCOUNTER — Telehealth: Payer: Self-pay | Admitting: *Deleted

## 2020-07-01 MED ORDER — SILVER SULFADIAZINE 1 % EX CREA: 1.0000 "application " | TOPICAL_CREAM | Freq: Every day | CUTANEOUS | 0 refills | Status: DC

## 2020-07-01 NOTE — Telephone Encounter (Signed)
Reviewed RN Rolly Salter note agreed with plan of care will follow up with patient on 07/03/2020 when onsite.  Electronic Rx signed for silvadene cream for patient.

## 2020-07-01 NOTE — Telephone Encounter (Signed)
Pt in to clinic for 2 issues. She cut L hand over thenar eminence with razor blade while scraping a label off an item. apprx 1.5cm lac with clean, well-approximated edges. Bleeding control  PTA to clinic. Cleaned with first aid antiseptic, 2 butterfly bandages applied, covered with tegaderm.  Also c/o burn to R FA. Occurred 3 days ago at home on oven while cooking. Area approx 3cm in length, 0.5cm wide with erythema just at edge of healing burn. Previously opened blister now with yellow crusting. Pt is currently only using burn gel on it to keep moisturized. Has previously used Silvadene cream on prior burn with good effect. Would like to use this again. Rx sent to pharmacy of choice. Advised to clean once daily by letting water run over area, then pat dry and cover with silvadene and dressing. Provided dressing supplies of telfa and ace bandage to keep covered.  Advised of s/sx infection, when to return to clinic if needed. Pt verbalizes understanding. Denies further questions/needs.

## 2020-07-03 NOTE — Telephone Encounter (Signed)
RN Rolly Salter attempted to find patient in workcenter at 1330 and was unsuccessful for follow up wound check.

## 2020-07-14 NOTE — Telephone Encounter (Signed)
Reviewed RN Rolly Salter note.  Patient injuries healing well and no questions or concerns.

## 2020-07-14 NOTE — Telephone Encounter (Signed)
Late entry. 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 Pt showed both burn and hand lac to RN. Both healing well. Barely able to visualize hand lac at this point. Burn with a pale, narrow scab over site but no erythema, warmth, drainage or other concerns. She denies further needs or concerns. Closing encounter.

## 2020-09-04 ENCOUNTER — Ambulatory Visit: Payer: Self-pay | Admitting: Registered Nurse

## 2020-09-04 ENCOUNTER — Other Ambulatory Visit: Payer: Self-pay

## 2020-09-04 ENCOUNTER — Encounter: Payer: Self-pay | Admitting: Registered Nurse

## 2020-09-04 VITALS — BP 130/94 | HR 107 | Temp 97.9°F

## 2020-09-04 DIAGNOSIS — R030 Elevated blood-pressure reading, without diagnosis of hypertension: Secondary | ICD-10-CM

## 2020-09-04 DIAGNOSIS — L309 Dermatitis, unspecified: Secondary | ICD-10-CM

## 2020-09-04 MED ORDER — TRIAMCINOLONE ACETONIDE 0.1 % EX CREA: 1.0000 "application " | TOPICAL_CREAM | Freq: Two times a day (BID) | CUTANEOUS | 0 refills | Status: DC

## 2020-09-04 MED ORDER — AQUAPHOR EX OINT: TOPICAL_OINTMENT | CUTANEOUS | 0 refills | Status: DC | PRN

## 2020-09-04 NOTE — Progress Notes (Signed)
Subjective:    Patient ID: Kimberly Rowland, female    DOB: 02-Feb-1981, 40 y.o.   MRN: [id number]  39y/o Caucasian established female pt c/o eczema flare to R arm for past few months, since the weather warmed up. Using Aveeno and OTC cortisone cream without any relief.  Left arm has small spot by elbow starting now also.  Denied bleeding/draining but is itching.  Denied headaches/visual changes/chest pain/dyspnea/shortness of breath/fever or chills.     Review of Systems  Constitutional:  Negative for activity change, appetite change, chills, diaphoresis, fatigue and fever.  HENT:  Negative for voice change.   Eyes:  Negative for photophobia and visual disturbance.  Respiratory:  Negative for cough, shortness of breath, wheezing and stridor.   Cardiovascular:  Negative for chest pain.  Gastrointestinal:  Negative for diarrhea, nausea and vomiting.  Endocrine: Negative for cold intolerance and heat intolerance.  Genitourinary:  Negative for difficulty urinating.  Musculoskeletal:  Negative for arthralgias, gait problem, joint swelling, neck pain and neck stiffness.  Skin:  Positive for color change and rash. Negative for pallor and wound.  Allergic/Immunologic: Negative for food allergies.  Neurological:  Negative for dizziness, tremors, weakness, light-headedness and headaches.  Hematological:  Negative for adenopathy. Does not bruise/bleed easily.  Psychiatric/Behavioral:  Negative for agitation, confusion and sleep disturbance.       Objective:   Physical Exam Vitals and nursing note reviewed.  Constitutional:      General: She is awake. She is not in acute distress.    Appearance: Normal appearance. She is well-developed and well-groomed. She is obese. She is not ill-appearing, toxic-appearing or diaphoretic.  HENT:     Head: Normocephalic and atraumatic.     Jaw: There is normal jaw occlusion.     Salivary Glands: Right salivary gland is not diffusely enlarged. Left salivary  gland is not diffusely enlarged.     Right Ear: Hearing and external ear normal.     Left Ear: Hearing and external ear normal.     Nose: Nose normal. No congestion or rhinorrhea.     Mouth/Throat:     Lips: Pink. No lesions.     Mouth: Mucous membranes are moist. No oral lesions or angioedema.     Pharynx: Oropharynx is clear. Uvula midline.  Eyes:     General: Lids are normal. Vision grossly intact. Gaze aligned appropriately. No allergic shiner or scleral icterus.       Right eye: No discharge.        Left eye: No discharge.     Conjunctiva/sclera: Conjunctivae normal.     Pupils: Pupils are equal, round, and reactive to light.  Neck:     Trachea: Trachea and phonation normal. No tracheal deviation.  Cardiovascular:     Rate and Rhythm: Normal rate and regular rhythm.     Pulses: Normal pulses.          Radial pulses are 2+ on the right side and 2+ on the left side.  Pulmonary:     Effort: Pulmonary effort is normal. No respiratory distress.     Breath sounds: Normal breath sounds and air entry. No stridor. No wheezing, rhonchi or rales.     Comments: Spoke full sentences without difficulty; no cough in exam room Abdominal:     Palpations: Abdomen is soft.  Musculoskeletal:        General: Tenderness present. No swelling, deformity or signs of injury. Normal range of motion.     Right elbow: No swelling,  deformity, effusion or lacerations. Normal range of motion. No tenderness.     Left elbow: No swelling, deformity, effusion or lacerations. Normal range of motion. No tenderness.     Right forearm: Tenderness present. No swelling, edema, deformity or lacerations.     Left forearm: No swelling, edema, deformity, lacerations or tenderness.     Right hand: No swelling, deformity or lacerations. Normal range of motion.     Left hand: No swelling, deformity or lacerations. Normal range of motion.       Arms:     Cervical back: Normal range of motion and neck supple. No rigidity.   Lymphadenopathy:     Head:     Right side of head: No preauricular adenopathy.     Left side of head: No preauricular adenopathy.     Cervical: No cervical adenopathy.     Right cervical: No superficial cervical adenopathy.    Left cervical: No superficial cervical adenopathy.  Skin:    General: Skin is warm and dry.     Capillary Refill: Capillary refill takes less than 2 seconds.     Coloration: Skin is not ashen, cyanotic, jaundiced, mottled, pale or sallow.     Findings: Abrasion and rash present. No abscess, acne, bruising, burn, ecchymosis, erythema, laceration, lesion, petechiae or wound. Rash is macular and papular. Rash is not crusting, nodular, purpuric, pustular, scaling, urticarial or vesicular.     Nails: There is no clubbing.       Neurological:     General: No focal deficit present.     Mental Status: She is alert and oriented to person, place, and time. Mental status is at baseline.     GCS: GCS eye subscore is 4. GCS verbal subscore is 5. GCS motor subscore is 6.     Cranial Nerves: Cranial nerves are intact. No cranial nerve deficit, dysarthria or facial asymmetry.     Sensory: Sensation is intact. No sensory deficit.     Motor: Motor function is intact. No weakness, tremor, atrophy, abnormal muscle tone or seizure activity.     Coordination: Coordination is intact. Coordination normal.     Gait: Gait is intact. Gait normal.     Comments: Gait sure and steady; in/out of chair and on/off exam table without difficulty; bilateral hand grasp 5/5  Psychiatric:        Attention and Perception: Attention and perception normal.        Mood and Affect: Mood and affect normal.        Speech: Speech normal.        Behavior: Behavior normal. Behavior is cooperative.        Thought Content: Thought content normal.        Cognition and Memory: Cognition and memory normal.        Judgment: Judgment normal.          Assessment & Plan:  A-Dermatitis initial visit right  forearm and elevated blood pressure  P-Electronic Rx sent to her pharmacy of choice triamcinolone 0.1% apply to affected area twice a day for 2 weeks #30 RF0.  Discussed at night can cover with plastic wrap affected area to help steroid penetrate affected skin also.   Wash hands before and after application.  Avoid hot steam showers.  Apply emollient twice a day e.g. Fragrance free vaseline/aquaphor-give 4 UD from clinic stock aquaohpr ointment.  Discussed apply immediately after shower to lock in moisture.  May apply ice/cold compress 5 minutes QID prn itching/swelling.  Avoid eye makeup use until rash cleared.  Avoid rubbing face at work.  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  Shower when she finishes work/prior to bedtime.  Avoid harsh/abrasive soaps use fragrance free/sensitive like dove/cetaphil. Due to itching/open areas recommend patient keeps right forearm covered with telfa gauze secured with surginette.  Patient given 2 surginettes and 10 telfa gauze from clinic stock and assisted her to apply aquaphor ointment, telfa and surginette to right arm prior to departing clinic.  If left arm rash worsening/itching cover also.   Medication as directed. Call or return to clinic as needed if these symptoms worsen or fail to improve as anticipated. [employer] handouts on atopic and contact dermatitis printed and given to patient. Follow up for re-evaluation if no improvement after 2 weeks triamcinolone/emollient use and/or worsening of rash/signs of infection e.g. swelling/red streaks/purulent drainage with plan of care. Patient verbalized agreement and understanding of treatment plan and had no further questions at this time   Patient on adderall.  Has been lifting in warehouse prior to appt.  Reviewed Epic and BP consistent with previous visits.  Discussed with  patient to discuss with her PCM at next adderall follow up visit.  If she has headaches/chest pain/dyspnea follow up for blood pressure re-evaluation sooner.  Patient verbalized understanding information/instructions, agreed with plan of care and had no further questions at this time.

## 2020-09-04 NOTE — Patient Instructions (Signed)
Contact Dermatitis Dermatitis is redness, soreness, and swelling (inflammation) of the skin. Contact dermatitis is a reaction to certain substances that touch the skin. Many different substances can cause contact dermatitis. There are two types of contact dermatitis: Irritant contact dermatitis. This type is caused by something that irritates your skin, such as having dry hands from washing them too often with soap. This type does not require previous exposure to the substance for a reaction to occur. This is the most common type. Allergic contact dermatitis. This type is caused by a substance that you are allergic to, such as poison ivy. This type occurs when you have been exposed to the substance (allergen) and develop a sensitivity to it. Dermatitis may develop soon after your first exposure to the allergen, or it may not develop until the next time you are exposed and every time thereafter. What are the causes? Irritant contact dermatitis is most commonly caused by exposure to: Makeup. Soaps. Detergents. Bleaches. Acids. Metal salts, such as nickel. Allergic contact dermatitis is most commonly caused by exposure to: Poisonous plants. Chemicals. Jewelry. Latex. Medicines. Preservatives in products, such as clothing. What increases the risk? You are more likely to develop this condition if you have: A job that exposes you to irritants or allergens. Certain medical conditions, such as asthma or eczema. What are the signs or symptoms? Symptoms of this condition may occur on your body anywhere the irritant has touched you or is touched by you. Symptoms include: Dryness or flaking. Redness. Cracks. Itching. Pain or a burning feeling. Blisters. Drainage of small amounts of blood or clear fluid from skin cracks. With allergic contact dermatitis, there may also be swelling in areas such asthe eyelids, mouth, or genitals. How is this diagnosed? This condition is diagnosed with a medical  history and physical exam. A patch skin test may be performed to help determine the cause. If the condition is related to your job, you may need to see an occupational medicine specialist. How is this treated? This condition is treated by checking for the cause of the reaction and protecting your skin from further contact. Treatment may also include: Steroid creams or ointments. Oral steroid medicines may be needed in more severe cases. Antibiotic medicines or antibacterial ointments, if a skin infection is present. Antihistamine lotion or an antihistamine taken by mouth to ease itching. A bandage (dressing). Follow these instructions at home: Skin care Moisturize your skin as needed. Apply cool compresses to the affected areas. Try applying baking soda paste to your skin. Stir water into baking soda until it reaches a paste-like consistency. Do not scratch your skin, and avoid friction to the affected area. Avoid the use of soaps, perfumes, and dyes. Medicines Take or apply over-the-counter and prescription medicines only as told by your health care provider. If you were prescribed an antibiotic medicine, take or apply the antibiotic as told by your health care provider. Do not stop using the antibiotic even if your condition improves. Bathing Try taking a bath with: Epsom salts. Follow the instructions on the packaging. You can get these at your local pharmacy or grocery store. Baking soda. Pour a small amount into the bath as directed by your health care provider. Colloidal oatmeal. Follow the instructions on the packaging. You can get this at your local pharmacy or grocery store. Bathe less frequently, such as every other day. Bathe in lukewarm water. Avoid using hot water. Bandage care If you were given a bandage (dressing), change it as told by   your health care provider. Wash your hands with soap and water before and after you change your dressing. If soap and water are not  available, use hand sanitizer. General instructions Avoid the substance that caused your reaction. If you do not know what caused it, keep a journal to try to track what caused it. Write down: What you eat. What cosmetic products you use. What you drink. What you wear in the affected area. This includes jewelry. Check the affected areas every day for signs of infection. Check for: More redness, swelling, or pain. More fluid or blood. Warmth. Pus or a bad smell. Keep all follow-up visits as told by your health care provider. This is important. Contact a health care provider if: Your condition does not improve with treatment. Your condition gets worse. You have signs of infection such as swelling, tenderness, redness, soreness, or warmth in the affected area. You have a fever. You have new symptoms. Get help right away if: You have a severe headache, neck pain, or neck stiffness. You vomit. You feel very sleepy. You notice red streaks coming from the affected area. Your bone or joint underneath the affected area becomes painful after the skin has healed. The affected area turns darker. You have difficulty breathing. Summary Dermatitis is redness, soreness, and swelling (inflammation) of the skin. Contact dermatitis is a reaction to certain substances that touch the skin. Symptoms of this condition may occur on your body anywhere the irritant has touched you or is touched by you. This condition is treated by figuring out what caused the reaction and protecting your skin from further contact. Treatment may also include medicines and skin care. Avoid the substance that caused your reaction. If you do not know what caused it, keep a journal to try to track what caused it. Contact a health care provider if your condition gets worse or you have signs of infection such as swelling, tenderness, redness, soreness, or warmth in the affected area. This information is not intended to replace  advice given to you by your health care provider. Make sure you discuss any questions you have with your healthcare provider. Document Revised: 06/28/2018 Document Reviewed: 09/21/2017 Elsevier Patient Education  2022 [employer] Atopic Dermatitis Atopic dermatitis is a skin disorder that causes inflammation of the skin. It is marked by a red rash and itchy, dry, scaly skin. It is the most common type of eczema. Eczema is a group of skin conditions that cause the skin to become rough and swollen. This condition is generally worse during the cooler wintermonths and often improves during the warm summer months. Atopic dermatitis usually starts showing signs in infancy and can last through adulthood. This condition cannot be passed from one person to another (is not contagious). Atopic dermatitis may not always be present, but when it is, it is called aflare-up. What are the causes? The exact cause of this condition is not known. Flare-ups may be triggered by: Coming in contact with something that you are sensitive or allergic to (allergen). Stress. Certain foods. Extremely hot or cold weather. Harsh chemicals and soaps. Dry air. Chlorine. What increases the risk? This condition is more likely to develop in people who have a personal or family history of: Eczema. Allergies. Asthma. Hay fever. What are the signs or symptoms? Symptoms of this condition include: Dry, scaly skin. Red, itchy rash. Itchiness, which can be severe. This may occur before the skin rash. This can make sleeping difficult. Skin thickening and cracking that can  occur over time. How is this diagnosed? This condition is diagnosed based on: Your symptoms. Your medical history. A physical exam. How is this treated? There is no cure for this condition, but symptoms can usually be controlled. Treatment focuses on: Controlling the itchiness and scratching. You may be given medicines, such as antihistamines or steroid  creams. Limiting exposure to allergens. Recognizing situations that cause stress and developing a plan to manage stress. If your atopic dermatitis does not get better with medicines, or if it is all over your body (widespread), a treatment using a specific type of light (phototherapy) may be used. Follow these instructions at home: Skin care  Keep your skin well moisturized. Doing this seals in moisture and helps to prevent dryness. Use unscented lotions that have petroleum in them. Avoid lotions that contain alcohol or water. They can dry the skin. Keep baths or showers short (less than 5 minutes) in warm water. Do not use hot water. Use mild, unscented cleansers for bathing. Avoid soap and bubble bath. Apply a moisturizer to your skin right after a bath or shower. Do not apply anything to your skin without checking with your health care provider.  General instructions Take or apply over-the-counter and prescription medicines only as told by your health care provider. Dress in clothes made of cotton or cotton blends. Dress lightly because heat increases itchiness. When washing your clothes, rinse your clothes twice so all of the soap is removed. Avoid any triggers that can cause a flare-up. Keep your fingernails cut short. Avoid scratching. Scratching makes the rash and itchiness worse. A break in the skin from scratching could result in a skin infection (impetigo). Do not be around people who have cold sores or fever blisters. If you get the infection, it may cause your atopic dermatitis to worsen. Keep all follow-up visits. This is important. Contact a health care provider if: Your itchiness interferes with sleep. Your rash gets worse or is not better within one week of starting treatment. You have a fever. You have a rash flare-up after having contact with someone who has cold sores or fever blisters. Get help right away if: You develop pus or soft yellow scabs in the rash  area. Summary Atopic dermatitis causes a red rash and itchy, dry, scaly skin. Treatment focuses on controlling the itchiness and scratching, limiting exposure to things that you are sensitive or allergic to (allergens), recognizing situations that cause stress, and developing a plan to manage stress. Keep your skin well moisturized. Keep baths or showers shorter than 5 minutes and use warm water. Do not use hot water. This information is not intended to replace advice given to you by your health care provider. Make sure you discuss any questions you have with your healthcare provider. Document Revised: 12/17/2019 Document Reviewed: 12/17/2019 Elsevier Patient Education  2022 [employer].

## 2020-09-09 ENCOUNTER — Telehealth: Payer: Self-pay | Admitting: Registered Nurse

## 2020-09-09 ENCOUNTER — Encounter: Payer: Self-pay | Admitting: Registered Nurse

## 2020-09-09 DIAGNOSIS — L309 Dermatitis, unspecified: Secondary | ICD-10-CM

## 2020-09-09 NOTE — Telephone Encounter (Signed)
Patient stopped NP in warehouse excited that her rash is resolving quickly now that she started triamcinolone cream.  She wished she had scheduled appt sooner.  Skin affected area less scaling noted and central erythema has cleared leaving healing patches medial and laterally now.  Patient to follow up for re-evaluation if worsening or new symptoms.  Continue plan of care as discussed.  Patient verbalized understanding information/instructions, agreed with plan of care and had no further questions at this time.  RN Rolly Salter notified me patient had stopped her in warehouse today also to tell her the good news and show her the improving arm skin.

## 2020-09-29 DIAGNOSIS — Y9389 Activity, other specified: Secondary | ICD-10-CM | POA: Insufficient documentation

## 2020-09-29 DIAGNOSIS — Z23 Encounter for immunization: Secondary | ICD-10-CM | POA: Diagnosis not present

## 2020-09-29 DIAGNOSIS — W25XXXA Contact with sharp glass, initial encounter: Secondary | ICD-10-CM | POA: Insufficient documentation

## 2020-09-29 DIAGNOSIS — S61011A Laceration without foreign body of right thumb without damage to nail, initial encounter: Secondary | ICD-10-CM | POA: Insufficient documentation

## 2020-09-29 DIAGNOSIS — S6991XA Unspecified injury of right wrist, hand and finger(s), initial encounter: Secondary | ICD-10-CM | POA: Diagnosis present

## 2020-09-30 ENCOUNTER — Emergency Department (HOSPITAL_COMMUNITY): Payer: No Typology Code available for payment source

## 2020-09-30 ENCOUNTER — Other Ambulatory Visit: Payer: Self-pay

## 2020-09-30 ENCOUNTER — Emergency Department (HOSPITAL_COMMUNITY)
Admission: EM | Admit: 2020-09-30 | Discharge: 2020-09-30 | Disposition: A | Discharge status: Home / Self Care | Payer: No Typology Code available for payment source | Attending: Emergency Medicine | Admitting: Emergency Medicine

## 2020-09-30 DIAGNOSIS — S61011A Laceration without foreign body of right thumb without damage to nail, initial encounter: Secondary | ICD-10-CM

## 2020-09-30 MED ORDER — CEPHALEXIN 500 MG PO CAPS: 500.0000 mg | ORAL_CAPSULE | Freq: Two times a day (BID) | ORAL | 0 refills | Status: AC

## 2020-09-30 MED ORDER — TETANUS-DIPHTH-ACELL PERTUSSIS 5-2.5-18.5 LF-MCG/0.5 IM SUSY
0.5000 mL | PREFILLED_SYRINGE | Freq: Once | INTRAMUSCULAR | Status: AC
  Administered 2020-09-30: 0.5 mL via INTRAMUSCULAR
  Filled 2020-09-30: qty 0.5

## 2020-09-30 MED ORDER — LIDOCAINE HCL (PF) 1 % IJ SOLN
5.0000 mL | Freq: Once | INTRAMUSCULAR | Status: AC
  Administered 2020-09-30: 5 mL via INTRADERMAL
  Filled 2020-09-30: qty 5

## 2020-09-30 MED ORDER — CEPHALEXIN 500 MG PO CAPS: 500.0000 mg | ORAL_CAPSULE | Freq: Two times a day (BID) | ORAL | 0 refills | Status: DC

## 2020-09-30 MED ORDER — CEPHALEXIN 250 MG PO CAPS
500.0000 mg | ORAL_CAPSULE | Freq: Once | ORAL | Status: AC
  Administered 2020-09-30: 500 mg via ORAL
  Filled 2020-09-30: qty 2

## 2020-09-30 NOTE — ED Triage Notes (Signed)
Pt c/o Lac to R thumb after cleaning glass. Bleeding controlled in triage, wound redressed

## 2020-09-30 NOTE — Discharge Instructions (Signed)
As discussed, with your thumb laceration, it is important you follow-up with our hand surgeon in his office.  If the office does not contact you, please call for follow-up this week.  Please take antibiotics as directed and monitor your condition carefully.  Return here for concerning changes.

## 2020-09-30 NOTE — ED Provider Notes (Signed)
[facility] EMERGENCY DEPARTMENT Provider Note   CSN: [id number] Arrival date & time: 09/29/20  2210     History Chief Complaint  Patient presents with   Finger Injury    Kimberly Rowland is a 40 y.o. female.  HPI Patient presents about 12 hours after sustaining a laceration to the thumb of her right hand.  She is right-hand dominant.  She was cleaning a glass when a piece of material broke, and lacerated the dorsum of her first MCP.  No other injuries, no other complaints.  There is pain, though only with palpation and range of motion.  No distal loss of sensation or weakness.  No other injuries.  No medication taken for pain control.    Past Medical History:  Diagnosis Date   ADD (attention deficit disorder)     Patient Active Problem List   Diagnosis Date Noted   Elevated blood pressure reading 04/27/2020   ADD (attention deficit disorder) 04/27/2020   Tobacco use 04/27/2020   Pre-eclampsia 04/27/2020   Normal labor and delivery 12/06/2014    No past surgical history on file.   OB History     Gravida  1   Para  1   Term  1   Preterm      AB      Living  1      SAB      IAB      Ectopic      Multiple  0   Live Births  1           No family history on file.  Social History   Tobacco Use   Smoking status: Never  Substance Use Topics   Alcohol use: No   Drug use: No    Home Medications Prior to Admission medications   Medication Sig Start Date End Date Taking? Authorizing Provider  cephALEXin (KEFLEX) 500 MG capsule Take 1 capsule (500 mg total) by mouth 2 (two) times daily for 5 days. 09/30/20 10/05/20 Yes Kimberly Munch, MD  amphetamine-dextroamphetamine (ADDERALL) 20 MG tablet Take 20 mg by mouth 4 (four) times daily. 04/11/20   [provider]  escitalopram (LEXAPRO) 20 MG tablet Take 1 tablet by mouth daily. 06/13/20   [provider]  ibuprofen (ADVIL,MOTRIN) 600 MG tablet Take 1 tablet (600 mg  total) by mouth every 6 (six) hours. 12/09/14   Kimberly Baars, MD  mineral oil-hydrophilic petrolatum (AQUAPHOR) ointment Apply topically as needed for dry skin. 09/04/20   Kimberly Rowland, Kimberly Song, NP  triamcinolone cream (KENALOG) 0.1 % Apply 1 application topically 2 (two) times daily. 09/04/20   Kimberly Rowland, Kimberly Song, NP    Allergies    Patient has no known allergies.  Review of Systems   Review of Systems  Constitutional:        Per HPI, otherwise negative  HENT:         Per HPI, otherwise negative  Respiratory:         Per HPI, otherwise negative  Cardiovascular:        Per HPI, otherwise negative  Gastrointestinal:  Negative for vomiting.  Endocrine:       Negative aside from HPI  Genitourinary:        Neg aside from HPI   Musculoskeletal:        Per HPI, otherwise negative  Skin: Negative.   Neurological:  Negative for syncope.   Physical Exam Updated Vital Signs BP (!) 147/108   Pulse 84  Temp 98 F (36.7 C) (Oral)   Resp 14   SpO2 98%   Physical Exam Vitals and nursing note reviewed.  Constitutional:      General: She is not in acute distress.    Appearance: She is well-developed.  HENT:     Head: Normocephalic and atraumatic.  Eyes:     Conjunctiva/sclera: Conjunctivae normal.  Cardiovascular:     Rate and Rhythm: Normal rate and regular rhythm.     Pulses: Normal pulses.  Pulmonary:     Effort: Pulmonary effort is normal. No respiratory distress.     Breath sounds: Normal breath sounds. No stridor.  Abdominal:     General: There is no distension.  Musculoskeletal:     Comments: At the dorsum of the first MCP and there is a laceration with 1 flap approximately 4 centimeters with a proximal attachment though with semi-viable tissue.  Medial to this there is a 0.5 cm avulsed area with a small flap.  With range of motion extensor tendons are visible medial and lateral no gross laceration. Remaining hand, musculoskeletal exam unremarkable.  Skin:    General: Skin  is warm and dry.  Neurological:     Mental Status: She is alert and oriented to person, place, and time.     Cranial Nerves: No cranial nerve deficit.    ED Results / Procedures / Treatments   Labs (all labs ordered are listed, but only abnormal results are displayed) Labs Reviewed - No data to display  EKG None  Radiology DG Hand Complete Right  Result Date: 09/30/2020 CLINICAL DATA:  Laceration to right thumb after cleaning glass EXAM: RIGHT HAND - COMPLETE 3+ VIEW COMPARISON:  None. FINDINGS: Soft tissue laceration noted along the dorsal radial aspect of the first digit at the level of the first metatarsal head. Overlying bandaging material is present. No visible radiopaque foreign bodies are seen within the soft tissues. No acute bony abnormality. Specifically, no fracture, subluxation, or dislocation. IMPRESSION: Soft tissue laceration along the dorsal radial aspect of the first digit at the level of the first metacarpal head. No retained foreign body. No acute osseous abnormality. Electronically Signed   By: Kimberly Rowland M.D.   On: 09/30/2020 02:28    Procedures Procedures   Medications Ordered in ED Medications  cephALEXin (KEFLEX) capsule 500 mg (has no administration in time range)  Tdap (BOOSTRIX) injection 0.5 mL (0.5 mLs Intramuscular Given 09/30/20 1038)  lidocaine (PF) (XYLOCAINE) 1 % injection 5 mL (5 mLs Intradermal Given by Other 09/30/20 1037)    ED Course  I have reviewed the triage vital signs and the nursing notes.  Pertinent labs & imaging results that were available during my care of the patient were reviewed by me and considered in my medical decision making (see chart for details).   Initial evaluation with consideration of tendon visibility, possible occult damage I discussed her case with our hand surgeon, Kimberly Rowland.  Patient will have wound approximation, follow-up in the clinic.  LACERATION REPAIR Performed by: Kimberly Rowland Authorized by: Kimberly Rowland Consent: Verbal consent obtained. Risks and benefits: risks, benefits and alternatives were discussed Consent given by: patient Patient identity confirmed: provided demographic data Prepped and Draped in normal sterile fashion Wound explored  Laceration Location: R thumb  Laceration Length: 4 cm  No Foreign Bodies seen or palpated  Anesthesia: local infiltration  Local anesthetic: lidocaine 1% no epinephrine  Anesthetic total: 3 ml  Irrigation method: syringe Amount of cleaning: standard  Skin closure: 6-0  Number of sutures: 3  Technique: flap approximation  Patient tolerance: Patient tolerated the procedure well with no immediate complications.     12:08 PM Patient in no distress, tolerated wound repair with approximation of the flap, with acknowledgment that tissue may be nonviable.  Patient had splint applied, discharged in stable condition.  Final Clinical Impression(s) / ED Diagnoses Final diagnoses:  Laceration of right thumb without foreign body without damage to nail, initial encounter    Rx / DC Orders ED Discharge Orders          Ordered    cephALEXin (KEFLEX) 500 MG capsule  2 times daily        09/30/20 1201             Kimberly Munch, MD 09/30/20 1209

## 2020-09-30 NOTE — ED Provider Notes (Signed)
Emergency Medicine Provider Triage Evaluation Note  Kimberly Rowland , a 40 y.o. female  was evaluated in triage.  Pt complains of laceration to R thumb. Cut on broken glass while washing dishes. Bleeding controlled with pressure. Pain mild, constant, aggravated by palpation. Last Tdap unknown.  Review of Systems  Positive: Laceration Negative: numbness  Physical Exam  BP (!) 146/110 (BP Location: Left Arm)   Pulse (!) 128   Temp 98.1 F (36.7 C)   Resp 18   SpO2 94%  Gen:   Awake, no distress   Resp:  Normal effort  MSK:   Moves extremities without difficulty  Other:  2.5cm laceration to right thenar eminence. No active bleeding.  Medical Decision Making  Medically screening exam initiated at 1:55 AM.  Appropriate orders placed.  Shela Esses was informed that the remainder of the evaluation will be completed by another provider, this initial triage assessment does not replace that evaluation, and the importance of remaining in the ED until their evaluation is complete.  Laceration R thumb   Antony Madura, PA-C 09/30/20 0157    Shon Baton, MD 09/30/20 [phone]

## 2020-10-07 ENCOUNTER — Telehealth: Payer: Self-pay | Admitting: Registered Nurse

## 2020-10-07 ENCOUNTER — Encounter: Payer: Self-pay | Admitting: Registered Nurse

## 2020-10-07 DIAGNOSIS — S61011D Laceration without foreign body of right thumb without damage to nail, subsequent encounter: Secondary | ICD-10-CM

## 2020-10-07 NOTE — Telephone Encounter (Signed)
Patient to clinic for wound check today.  2 stitches intact dorsum right thumb, wound edges well approximated no discharge or debris noted or swelling/erythema.  Clean/dry and intact sutures.  Patient applying neosporin and washing with soap and water.  Denied questions or concerns today.  Skin warm dry and pink and denied pain.  Patient A&Ox3 spoke full sentences without difficulty gait sure and steady in clinic along with respirations even and unlabored.

## 2021-01-16 ENCOUNTER — Inpatient Hospital Stay (HOSPITAL_COMMUNITY)
Admission: AD | Admit: 2021-01-16 | Discharge: 2021-01-16 | Disposition: A | Discharge status: Home / Self Care | Payer: No Typology Code available for payment source | Attending: Obstetrics & Gynecology | Admitting: Obstetrics & Gynecology

## 2021-01-16 ENCOUNTER — Other Ambulatory Visit: Payer: Self-pay

## 2021-01-16 ENCOUNTER — Encounter (HOSPITAL_COMMUNITY): Payer: Self-pay | Admitting: Obstetrics & Gynecology

## 2021-01-16 ENCOUNTER — Inpatient Hospital Stay (HOSPITAL_COMMUNITY): Payer: No Typology Code available for payment source

## 2021-01-16 DIAGNOSIS — O26851 Spotting complicating pregnancy, first trimester: Secondary | ICD-10-CM | POA: Diagnosis not present

## 2021-01-16 DIAGNOSIS — O26899 Other specified pregnancy related conditions, unspecified trimester: Secondary | ICD-10-CM

## 2021-01-16 DIAGNOSIS — Z3A01 Less than 8 weeks gestation of pregnancy: Secondary | ICD-10-CM | POA: Diagnosis not present

## 2021-01-16 DIAGNOSIS — R109 Unspecified abdominal pain: Secondary | ICD-10-CM | POA: Insufficient documentation

## 2021-01-16 DIAGNOSIS — O209 Hemorrhage in early pregnancy, unspecified: Secondary | ICD-10-CM

## 2021-01-16 DIAGNOSIS — O26891 Other specified pregnancy related conditions, first trimester: Secondary | ICD-10-CM | POA: Diagnosis not present

## 2021-01-16 DIAGNOSIS — O3680X0 Pregnancy with inconclusive fetal viability, not applicable or unspecified: Secondary | ICD-10-CM | POA: Insufficient documentation

## 2021-01-16 LAB — CBC
HCT: 40.4 % (ref 36.0–46.0)
Hemoglobin: 12.9 g/dL (ref 12.0–15.0)
MCH: 29.8 pg (ref 26.0–34.0)
MCHC: 31.9 g/dL (ref 30.0–36.0)
MCV: 93.3 fL (ref 80.0–100.0)
Platelets: 314 10*3/uL (ref 150–400)
RBC: 4.33 MIL/uL (ref 3.87–5.11)
RDW: 13.9 % (ref 11.5–15.5)
WBC: 12.3 10*3/uL — ABNORMAL HIGH (ref 4.0–10.5)
nRBC: 0 % (ref 0.0–0.2)

## 2021-01-16 LAB — WET PREP, GENITAL
Clue Cells Wet Prep HPF POC: NONE SEEN
Sperm: NONE SEEN
Trich, Wet Prep: NONE SEEN
Yeast Wet Prep HPF POC: NONE SEEN

## 2021-01-16 LAB — POCT PREGNANCY, URINE: Preg Test, Ur: POSITIVE — AB

## 2021-01-16 LAB — ABO/RH: ABO/RH(D): B POS

## 2021-01-16 LAB — HCG, QUANTITATIVE, PREGNANCY: hCG, Beta Chain, Quant, S: 1214 m[IU]/mL — ABNORMAL HIGH (ref <5)

## 2021-01-16 NOTE — MAU Note (Signed)
PT DID HPT ON 12-18-2020- POSITIVE  APPOINTMENT IS Friday - WITH [facility]-  DR Claiborne Billings STARTED BLEEDING ON WED- STARTED WHEN  SHE WIPED TOILET PAPER IN TRIAGE - 1 SMALL STREAK NO CRAMPING

## 2021-01-16 NOTE — MAU Provider Note (Signed)
Chief Complaint:  Vaginal Bleeding    HPI: Kimberly Rowland is a 40 y.o. G2P1001 at [redacted]w[redacted]d who presents to maternity admissions reporting vaginal spotting. Patient reports a +upt. On Wednesday, reports she started having some pink vaginal spotting. Yesterday it was a little heavier and darker. She is not wearing a pad, but reports she places tissue in her underwear, which she has changed only 2-3 times today. She denies pain, vaginal itching, odor, or urinary s/s. Unsure of LMP but reports it was some time in September.   Pregnancy Course:   Past Medical History:  Diagnosis Date   ADD (attention deficit disorder)    OB History  Gravida Para Term Preterm AB Living  2 1 1     1   SAB IAB Ectopic Multiple Live Births        0 1    # Outcome Date GA Lbr Len/2nd Weight Sex Delivery Anes PTL Lv  2 Current           1 Term 12/06/14 [redacted]w[redacted]d 16:36 / 02:14 3345 g M Vag-Spont EPI  LIV   No past surgical history on file. No family history on file. Social History   Tobacco Use   Smoking status: Never  Substance Use Topics   Alcohol use: No   Drug use: No   No Known Allergies No medications prior to admission.   I have reviewed patient's Past Medical Hx, Surgical Hx, Family Hx, Social Hx, medications and allergies.   ROS:  Review of Systems  Constitutional: Negative.   Respiratory: Negative.    Cardiovascular: Negative.   Gastrointestinal: Negative.   Genitourinary:  Positive for vaginal bleeding (spotting). Negative for dysuria and pelvic pain.  Musculoskeletal: Negative.   Neurological: Negative.   Psychiatric/Behavioral: Negative.     Physical Exam  Patient Vitals for the past 24 hrs:  BP Temp Pulse Resp Height Weight  01/16/21 2159 (!) 132/98 -- 94 -- -- --  01/16/21 1952 (!) 148/98 98.7 F (37.1 C) (!) 108 20 5\' 4"  (1.626 m) 76.4 kg   Constitutional: well-developed, well-nourished female in no acute distress.  Cardiovascular: normal rate Respiratory: normal effort GI: abd  soft, non-tender MS: extremities nontender, no edema, normal ROM Neurologic: alert and oriented x 4.  Pelvic: blind swabs collected     Labs: Results for orders placed or performed during the hospital encounter of 01/16/21 (from the past 24 hour(s))  Pregnancy, urine POC     Status: Abnormal   Collection Time: 01/16/21  8:16 PM  Result Value Ref Range   Preg Test, Ur POSITIVE (A) NEGATIVE  Wet prep, genital     Status: Abnormal   Collection Time: 01/16/21  8:22 PM  Result Value Ref Range   Yeast Wet Prep HPF POC NONE SEEN NONE SEEN   Trich, Wet Prep NONE SEEN NONE SEEN   Clue Cells Wet Prep HPF POC NONE SEEN NONE SEEN   WBC, Wet Prep HPF POC MANY (A) NONE SEEN   Sperm NONE SEEN   CBC     Status: Abnormal   Collection Time: 01/16/21  8:33 PM  Result Value Ref Range   WBC 12.3 (H) 4.0 - 10.5 K/uL   RBC 4.33 3.87 - 5.11 MIL/uL   Hemoglobin 12.9 12.0 - 15.0 g/dL   HCT 01/18/21 01/18/21 - 82.7 %   MCV 93.3 80.0 - 100.0 fL   MCH 29.8 26.0 - 34.0 pg   MCHC 31.9 30.0 - 36.0 g/dL   RDW 07.8 67.5 -  15.5 %   Platelets 314 150 - 400 K/uL   nRBC 0.0 0.0 - 0.2 %  hCG, quantitative, pregnancy     Status: Abnormal   Collection Time: 01/16/21  8:33 PM  Result Value Ref Range   hCG, Beta Chain, Quant, S 1,214 (H) <5 mIU/mL  ABO/Rh     Status: None   Collection Time: 01/16/21  8:33 PM  Result Value Ref Range   ABO/RH(D)      B POS Performed at [facility], [address], [place], [place] [address]     Imaging:  US OB LESS THAN 14 WEEKS WITH OB TRANSVAGINAL  Result Date: 01/16/2021 CLINICAL DATA:  Initial evaluation for light vaginal bleeding, early pregnancy. EXAM: OBSTETRIC <14 WK [place] AND TRANSVAGINAL OB US TECHNIQUE: Both transabdominal and transvaginal ultrasound examinations were performed for complete evaluation of the gestation as well as the maternal uterus, adnexal regions, and pelvic cul-de-sac. Transvaginal technique was performed to assess early pregnancy. COMPARISON:  None.  FINDINGS: Intrauterine gestational sac: Negative. Endometrial stripe measures 14.5 mm in thickness. Yolk sac:  Negative. Embryo:  Negative. Cardiac Activity: Negative. Heart Rate: N/A Subchorionic hemorrhage:  None visualized. Maternal uterus/adnexae: Right ovary within normal limits. Small corpus luteal cyst noted within the left ovary. No adnexal mass or free fluid. IMPRESSION: 1. Early pregnancy with no discrete IUP or adnexal mass identified. Finding is consistent with a pregnancy of unknown anatomic location. Differential considerations include IUP to early to visualize, recent SAB, or possibly occult ectopic pregnancy. Close clinical monitoring with serial beta HCGs and close interval follow-up ultrasound recommended as clinically warranted. 2. No other acute maternal uterine or adnexal abnormality. Electronically Signed   By: Rise Mu M.D.   On: 01/16/2021 21:39    MAU Course: Orders Placed This Encounter  Procedures   Wet prep, genital   US OB LESS THAN 14 WEEKS WITH OB TRANSVAGINAL   CBC   hCG, quantitative, pregnancy   Pregnancy, urine POC   ABO/Rh   Discharge patient   No orders of the defined types were placed in this encounter.   MDM: CBC, HCG ABO/RH, B Pos, Rhogam not indicated Wet prep, GC/CT collected [place] with results as above   Assessment: 1. Pregnancy of unknown anatomic location   2. Abdominal pain affecting pregnancy   3. Vaginal bleeding affecting early pregnancy     Plan: Discharge home in stable condition Strict return precautions reviewed Patient to return Sunday evening for repeat bHCG. Return sooner if symptoms worsen      Follow-up Information     [facility] Follow up.   Specialty: Obstetrics and Gynecology Why: on Sunday evening for repeat HCG. Return sooner if symptoms worsen. Contact information: [address] [id number] mc [place] [address] [phone]                Allergies  as of 01/16/2021   No Known Allergies      Medication List     STOP taking these medications    amphetamine-dextroamphetamine 20 MG tablet Commonly known as: ADDERALL   ibuprofen 600 MG tablet Commonly known as: ADVIL       TAKE these medications    escitalopram 20 MG tablet Commonly known as: LEXAPRO Take 1 tablet by mouth daily.   mineral oil-hydrophilic petrolatum ointment Apply topically as needed for dry skin.   triamcinolone cream 0.1 % Commonly known as: KENALOG Apply 1 application topically 2 (two) times daily.  Camelia Eng, CNM 01/16/2021 10:01 PM

## 2021-01-18 ENCOUNTER — Other Ambulatory Visit: Payer: Self-pay

## 2021-01-18 ENCOUNTER — Inpatient Hospital Stay (HOSPITAL_COMMUNITY)
Admission: AD | Admit: 2021-01-18 | Discharge: 2021-01-18 | Disposition: A | Discharge status: Home / Self Care | Payer: No Typology Code available for payment source | Attending: Obstetrics and Gynecology | Admitting: Obstetrics and Gynecology

## 2021-01-18 DIAGNOSIS — O200 Threatened abortion: Secondary | ICD-10-CM | POA: Insufficient documentation

## 2021-01-18 DIAGNOSIS — Z3A01 Less than 8 weeks gestation of pregnancy: Secondary | ICD-10-CM | POA: Insufficient documentation

## 2021-01-18 DIAGNOSIS — O209 Hemorrhage in early pregnancy, unspecified: Secondary | ICD-10-CM | POA: Insufficient documentation

## 2021-01-18 DIAGNOSIS — O3680X0 Pregnancy with inconclusive fetal viability, not applicable or unspecified: Secondary | ICD-10-CM | POA: Insufficient documentation

## 2021-01-18 LAB — HCG, QUANTITATIVE, PREGNANCY: hCG, Beta Chain, Quant, S: 839 m[IU]/mL — ABNORMAL HIGH (ref <5)

## 2021-01-18 NOTE — MAU Provider Note (Signed)
Subjective:  Kimberly Rowland is a 40 y.o. G2P1001 at [redacted]w[redacted]d who presents today for FU BHCG. She was seen on 01/16/2021. Results from that day show no IUP on [place], and HCG 1214. She reports vaginal bleeding. She denies abdominal or pelvic pain.  Reports bleeding is unchanged from previous visit.  Objective:  Physical Exam  Nursing note and vitals reviewed. Constitutional: She is oriented to person, place, and time. She appears well-developed and well-nourished. No distress.  HENT:  Head: Normocephalic.  Cardiovascular: Normal rate.  Respiratory: Effort normal.  GI: Soft. There is no tenderness.  Neurological: She is alert and oriented to person, place, and time. Skin: Skin is warm and dry.  Psychiatric: She has a normal mood and affect.   Results for orders placed or performed during the hospital encounter of 01/18/21 (from the past 24 hour(s))  hCG, quantitative, pregnancy     Status: Abnormal   Collection Time: 01/18/21  8:15 PM  Result Value Ref Range   hCG, Beta Chain, Quant, S 839 (H) <5 mIU/mL    Assessment/Plan: Pregnancy of unknown location Having bleeding, no pain. Likely SAB HCG did not rise appropriately FU this week at [facility]; discussed patient with Dr. Tenny Craw prior to DC home. B positive blood type Ectopic precautions.   Duane Lope, NP 01/18/2021 9:58 PM

## 2021-01-18 NOTE — MAU Note (Signed)
..  Kimberly Rowland is a 40 y.o. at [redacted]w[redacted]d here in MAU reporting: returns to HCG levels check. Denies pain. Reports that she is still having some bleeding but it is the same as the last time she was here.   Pain score: 0/10 Vitals:   01/18/21 1937  BP: (!) 132/96  Pulse: 88  Resp: 19  Temp: 97.9 F (36.6 C)  SpO2: 100%

## 2021-01-19 LAB — GC/CHLAMYDIA PROBE AMP (~~LOC~~) NOT AT ARMC
Chlamydia: NEGATIVE
Comment: NEGATIVE
Comment: NORMAL
Neisseria Gonorrhea: NEGATIVE

## 2021-01-23 DIAGNOSIS — O039 Complete or unspecified spontaneous abortion without complication: Secondary | ICD-10-CM | POA: Insufficient documentation

## 2021-08-10 ENCOUNTER — Encounter: Payer: Self-pay | Admitting: *Deleted

## 2021-08-10 ENCOUNTER — Ambulatory Visit: Payer: Self-pay | Admitting: *Deleted

## 2021-08-10 VITALS — BP 132/102 | HR 102

## 2021-08-10 DIAGNOSIS — R030 Elevated blood-pressure reading, without diagnosis of hypertension: Secondary | ICD-10-CM

## 2021-08-10 NOTE — Progress Notes (Signed)
Pt reports BP high at home this weekend with boyfriend's home cuff, 144/102. Today in clinic is similar. Advised to keep log of BPs checked at home for several weeks checking at different times of the day and take to OB/Gyn that is prescribing new oral birth control for review and treatment. Pt agreeable.  NP Appt made for eczema f/u tomorrow. Is not using anything on skin and eczema at R elbow is worsening. Provided with aquaphor from clinic [place] with instructions for use after luke warm showers and on top of topical steroid. Appt made as pt out of triamcinolone and asking if there is a stronger option.

## 2021-08-11 ENCOUNTER — Encounter: Payer: Self-pay | Admitting: Registered Nurse

## 2021-08-11 ENCOUNTER — Ambulatory Visit: Payer: Self-pay | Admitting: Registered Nurse

## 2021-08-11 VITALS — BP 136/99 | HR 103 | Temp 99.4°F

## 2021-08-11 DIAGNOSIS — L309 Dermatitis, unspecified: Secondary | ICD-10-CM

## 2021-08-11 MED ORDER — DIPHENHYDRAMINE HCL 2 % EX GEL: 1.0000 "application " | Freq: Two times a day (BID) | CUTANEOUS | Status: AC | PRN

## 2021-08-11 MED ORDER — CETIRIZINE HCL 10 MG PO TABS: 10.0000 mg | ORAL_TABLET | Freq: Two times a day (BID) | ORAL | 0 refills | Status: DC | PRN

## 2021-08-11 MED ORDER — TRIAMCINOLONE ACETONIDE 0.1 % EX CREA: 1.0000 "application " | TOPICAL_CREAM | Freq: Two times a day (BID) | CUTANEOUS | 1 refills | Status: AC

## 2021-08-11 NOTE — Progress Notes (Signed)
Subjective:    Patient ID: Kimberly Rowland, female    DOB: Jan 08, 1981, 41 y.o.   MRN: [id number]  41y/o caucasian established female pt presenting for c/o eczema right arm/elbow scaley/scabbing/itching. Has not tried oral or topical antihistamine.  Ran out of triamcinolone  Worsening during cold months and not improving currently but not treating at home either. Rare moisturizer use. Asking if there is a stronger steroid to try next.   Denied discharge/fever/chills.  Patient also asking what she needs to do regarding consultation for breast reduction.       Review of Systems  Constitutional:  Negative for activity change, appetite change, chills, diaphoresis, fatigue and fever.  HENT:  Negative for trouble swallowing and voice change.   Eyes:  Negative for photophobia and visual disturbance.  Respiratory:  Negative for cough, shortness of breath, wheezing and stridor.   Cardiovascular:  Negative for chest pain.  Gastrointestinal:  Negative for diarrhea, nausea and vomiting.  Endocrine: Negative for cold intolerance and heat intolerance.  Genitourinary:  Negative for difficulty urinating.  Musculoskeletal:  Positive for back pain, myalgias and neck pain. Negative for gait problem, joint swelling and neck stiffness.  Skin:  Positive for color change and rash.  Allergic/Immunologic: Negative for environmental allergies and food allergies.  Neurological:  Negative for dizziness, tremors, seizures, syncope, facial asymmetry, speech difficulty, weakness, light-headedness, numbness and headaches.  Hematological:  Negative for adenopathy. Does not bruise/bleed easily.  Psychiatric/Behavioral:  Negative for agitation, confusion and sleep disturbance.       Objective:   Physical Exam Vitals and nursing note reviewed.  Constitutional:      General: She is awake. She is not in acute distress.    Appearance: Normal appearance. She is well-developed, well-groomed and overweight. She is not  ill-appearing, toxic-appearing or diaphoretic.  HENT:     Head: Normocephalic and atraumatic.     Jaw: There is normal jaw occlusion.     Salivary Glands: Right salivary gland is not diffusely enlarged. Left salivary gland is not diffusely enlarged.     Right Ear: Hearing and external ear normal.     Left Ear: Hearing and external ear normal.     Nose: Nose normal. No congestion or rhinorrhea.     Mouth/Throat:     Lips: Pink. No lesions.     Mouth: Mucous membranes are moist.     Pharynx: Oropharynx is clear.  Eyes:     General: Lids are normal. Vision grossly intact. Gaze aligned appropriately. No allergic shiner or scleral icterus.       Right eye: No discharge.        Left eye: No discharge.     Extraocular Movements: Extraocular movements intact.     Conjunctiva/sclera: Conjunctivae normal.     Pupils: Pupils are equal, round, and reactive to light.  Neck:     Trachea: Trachea and phonation normal. No tracheal deviation.  Cardiovascular:     Rate and Rhythm: Normal rate and regular rhythm.     Pulses:          Radial pulses are 2+ on the right side and 2+ on the left side.  Pulmonary:     Effort: Pulmonary effort is normal.     Breath sounds: Normal breath sounds and air entry. No stridor or transmitted upper airway sounds. No wheezing.     Comments: Spoke full sentences without difficulty; no cough observed in exam room Abdominal:     General: Abdomen is flat.  Musculoskeletal:  General: Normal range of motion.     Right upper arm: No swelling, edema, deformity, lacerations or tenderness.     Left upper arm: No swelling, edema, deformity, lacerations or tenderness.     Right elbow: No swelling, deformity, effusion or lacerations. Normal range of motion. Tenderness present.     Left elbow: No swelling, deformity, effusion or lacerations. Normal range of motion. No tenderness.     Right forearm: No swelling, edema, deformity, lacerations or tenderness.     Left  forearm: No swelling, edema, deformity, lacerations or tenderness.     Right wrist: No swelling, deformity, effusion, lacerations or crepitus.     Left wrist: No swelling, deformity, effusion, lacerations or crepitus.     Right hand: No swelling, deformity, lacerations, tenderness or bony tenderness. Normal range of motion. Normal strength. Normal capillary refill.     Left hand: No swelling, deformity, lacerations, tenderness or bony tenderness. Normal range of motion. Normal strength. Normal capillary refill.     Cervical back: Normal range of motion and neck supple. No swelling, edema, deformity, erythema, signs of trauma, lacerations, rigidity or crepitus. Normal range of motion.  Lymphadenopathy:     Head:     Right side of head: No submandibular or preauricular adenopathy.     Left side of head: No submandibular or preauricular adenopathy.     Cervical:     Right cervical: No superficial cervical adenopathy.    Left cervical: No superficial cervical adenopathy.  Skin:    General: Skin is warm and dry.     Capillary Refill: Capillary refill takes less than 2 seconds.     Coloration: Skin is not ashen, cyanotic, jaundiced, mottled, pale or sallow.     Findings: Abrasion and rash present. No abscess, acne, bruising, burn, ecchymosis, erythema, signs of injury, laceration, petechiae or wound. Rash is macular, papular and scaling. Rash is not crusting, nodular, purpuric, pustular, urticarial or vesicular.     Nails: There is no clubbing.          Comments: Left posterior elbow grouped macular papular brown scabbed/scaling dry coalesced 5x7cm distinct border rash; linear abrasions noted superior and distally red dry 1-3cm length  Neurological:     General: No focal deficit present.     Mental Status: She is alert and oriented to person, place, and time. Mental status is at baseline.     GCS: GCS eye subscore is 4. GCS verbal subscore is 5. GCS motor subscore is 6.     Cranial Nerves:  Cranial nerves 2-12 are intact. No cranial nerve deficit, dysarthria or facial asymmetry.     Sensory: Sensation is intact.     Motor: Motor function is intact. No weakness, tremor, atrophy, abnormal muscle tone or seizure activity.     Coordination: Coordination is intact. Coordination normal.     Gait: Gait is intact. Gait normal.     Comments: In/out of chair without difficulty; on/off exam table without difficulty; gait sure and steady in clinic; bilateral hand grasp equal 5/5  Psychiatric:        Attention and Perception: Attention and perception normal.        Mood and Affect: Mood and affect normal.        Speech: Speech normal.        Behavior: Behavior normal. Behavior is cooperative.        Thought Content: Thought content normal.        Cognition and Memory: Cognition and memory normal.  Judgment: Judgment normal.          Assessment & Plan:   A-atopic dermatitis  P-Patient had stopped emollient and topical corticosteroid.  Refilled triamcinolone cream 0.1% BID prn x 2 weeks #30 gm RF1 electronic rx to her pharmacy of choice.  Given 8 UD aquaphor ointment and applied 2 while in clinic.  Discussed covering with saran wrap after triamcinolone/aquaphor application to assist with emollient absorption/avoid scratching.  May apply ice 15 minutes prn.  Given calagel 2 UD may apply topical BID prn itching.  Consider zyrtec 10mg  po BID prn itching. Follow up re-evaluation if discharge/redness/swelling/worsening (area affected enlarging). Avoid steam showers and apply emollient after bathing daily.   Patient verbalized understanding information/instructions, agreed with plan of care and had no further questions at this time.  Discussed with patient if having neck/back pain insurance may cover breast reduction.  I recommend reviewing network insurance providers and scheduling consult.  RN can assist with provider names/office locations if needed.  Patient would need to call her  insurance to discuss what copays/deductibles are part of her plan coverage also.  Patient verbalized understanding information/instructions and had no further questions at this time.

## 2021-08-11 NOTE — Patient Instructions (Signed)
Atopic Dermatitis ?Atopic dermatitis is a skin disorder that causes inflammation of the skin. It is marked by a red rash and itchy, dry, scaly skin. It is the most common type of eczema. Eczema is a group of skin conditions that cause the skin to become rough and swollen. This condition is generally worse during the cooler winter months and often improves during the warm summer months. ?Atopic dermatitis usually starts showing signs in infancy and can last through adulthood. This condition cannot be passed from one person to another (is not contagious). Atopic dermatitis may not always be present, but when it is, it is called a flare-up. ?What are the causes? ?The exact cause of this condition is not known. Flare-ups may be triggered by: ?Coming in contact with something that you are sensitive or allergic to (allergen). ?Stress. ?Certain foods. ?Extremely hot or cold weather. ?Harsh chemicals and soaps. ?Dry air. ?Chlorine. ?What increases the risk? ?This condition is more likely to develop in people who have a personal or family history of: ?Eczema. ?Allergies. ?Asthma. ?Hay fever. ?What are the signs or symptoms? ?Symptoms of this condition include: ?Dry, scaly skin. ?Red, itchy rash. ?Itchiness, which can be severe. This may occur before the skin rash. This can make sleeping difficult. ?Skin thickening and cracking that can occur over time. ?How is this diagnosed? ?This condition is diagnosed based on: ?Your symptoms. ?Your medical history. ?A physical exam. ?How is this treated? ?There is no cure for this condition, but symptoms can usually be controlled. Treatment focuses on: ?Controlling the itchiness and scratching. You may be given medicines, such as antihistamines or steroid creams. ?Limiting exposure to allergens. ?Recognizing situations that cause stress and developing a plan to manage stress. ?If your atopic dermatitis does not get better with medicines, or if it is all over your body (widespread), a  treatment using a specific type of light (phototherapy) may be used. ?Follow these instructions at home: ?Skin care ? ?Keep your skin well moisturized. Doing this seals in moisture and helps to prevent dryness. ?Use unscented lotions that have petroleum in them. ?Avoid lotions that contain alcohol or water. They can dry the skin. ?Keep baths or showers short (less than 5 minutes) in warm water. Do not use hot water. ?Use mild, unscented cleansers for bathing. Avoid soap and bubble bath. ?Apply a moisturizer to your skin right after a bath or shower. ?Do not apply anything to your skin without checking with your health care provider. ?General instructions ?Take or apply over-the-counter and prescription medicines only as told by your health care provider. ?Dress in clothes made of cotton or cotton blends. Dress lightly because heat increases itchiness. ?When washing your clothes, rinse your clothes twice so all of the soap is removed. ?Avoid any triggers that can cause a flare-up. ?Keep your fingernails cut short. ?Avoid scratching. Scratching makes the rash and itchiness worse. A break in the skin from scratching could result in a skin infection (impetigo). ?Do not be around people who have cold sores or fever blisters. If you get the infection, it may cause your atopic dermatitis to worsen. ?Keep all follow-up visits. This is important. ?Contact a health care provider if: ?Your itchiness interferes with sleep. ?Your rash gets worse or is not better within one week of starting treatment. ?You have a fever. ?You have a rash flare-up after having contact with someone who has cold sores or fever blisters. ?Get help right away if: ?You develop pus or soft yellow scabs in the rash   area. ?Summary ?Atopic dermatitis causes a red rash and itchy, dry, scaly skin. ?Treatment focuses on controlling the itchiness and scratching, limiting exposure to things that you are sensitive or allergic to (allergens), recognizing  situations that cause stress, and developing a plan to manage stress. ?Keep your skin well moisturized. ?Keep baths or showers shorter than 5 minutes and use warm water. Do not use hot water. ?This information is not intended to replace advice given to you by your health care provider. Make sure you discuss any questions you have with your health care provider. ?Document Revised: 12/17/2019 Document Reviewed: 12/17/2019 ?Elsevier Patient Education ? 2023 [employer] ? ?

## 2021-08-24 ENCOUNTER — Ambulatory Visit: Payer: Self-pay | Admitting: *Deleted

## 2021-08-24 VITALS — BP 148/107

## 2021-08-24 DIAGNOSIS — R030 Elevated blood-pressure reading, without diagnosis of hypertension: Secondary | ICD-10-CM

## 2021-08-24 NOTE — Progress Notes (Signed)
Pt stopped her birth control on Friday 6/2 as she feels it is contributing to her high BP. Advised pt her Vyvanse can have hypertension side effect as well.  Asked pt to return for recheck on one week. Will have her sit full 5 minutes in calm waiting area and recheck. If still elevated, will discuss helping her establish with a primary care provider.

## 2021-09-24 ENCOUNTER — Telehealth: Payer: Self-pay | Admitting: Registered Nurse

## 2021-09-24 ENCOUNTER — Encounter: Payer: Self-pay | Admitting: Registered Nurse

## 2021-09-24 ENCOUNTER — Ambulatory Visit: Payer: Self-pay | Admitting: Registered Nurse

## 2021-09-24 VITALS — BP 150/90 | HR 140 | Resp 18

## 2021-09-24 DIAGNOSIS — Z881 Allergy status to other antibiotic agents status: Secondary | ICD-10-CM

## 2021-09-24 DIAGNOSIS — H6692 Otitis media, unspecified, left ear: Secondary | ICD-10-CM

## 2021-09-24 DIAGNOSIS — L299 Pruritus, unspecified: Secondary | ICD-10-CM

## 2021-09-24 MED ORDER — FAMOTIDINE 10 MG PO TABS: 10.0000 mg | ORAL_TABLET | Freq: Two times a day (BID) | ORAL | 0 refills | Status: DC

## 2021-09-24 MED ORDER — DOXYCYCLINE HYCLATE 100 MG PO TABS: 100.0000 mg | ORAL_TABLET | Freq: Two times a day (BID) | ORAL | 0 refills | Status: DC

## 2021-09-24 MED ORDER — DIPHENHYDRAMINE HCL 25 MG PO CAPS: 50.0000 mg | ORAL_CAPSULE | Freq: Four times a day (QID) | ORAL | 0 refills | Status: DC | PRN

## 2021-09-24 MED ORDER — EPINEPHRINE 0.3 MG/0.3ML IJ SOAJ: 0.3000 mg | INTRAMUSCULAR | 1 refills | Status: AC | PRN

## 2021-09-24 NOTE — Progress Notes (Signed)
Subjective:    Patient ID: Kimberly Rowland, female    DOB: March 13, 1981, 41 y.o.   MRN: [id number]  40y/o caucasian female established patient here earlier today for evaluation left ear infection "I have ear camera and pus seen on image" yesterday. Patient denied pain/discharge on pillow.  Initiated on augmentin 875mg  po BID and took first dose 1245pm today.  Patient returned to clinic approximately 1320 stating she was starting to itch hands and earlobes swelling and she remember she had same reaction after taking amoxicillin at dentist.  Had reviewed patient paper chart at [facility] and asked patient if any drug allergies at earlier appt and patient denied any known antibiotic allergies.  Patient itching arms while talking full sentences without difficulty.  Denied n/v/d.  Feeling a little hot/flushed.  "I am getting anxious."     Review of Systems  Constitutional:  Negative for activity change, appetite change, chills, diaphoresis, fatigue and fever.  HENT:  Negative for congestion, dental problem, drooling, ear discharge, ear pain, facial swelling, hearing loss, mouth sores, nosebleeds, postnasal drip, rhinorrhea, sinus pressure, sinus pain, sneezing, sore throat, trouble swallowing and voice change.   Eyes:  Negative for photophobia, itching and visual disturbance.  Respiratory:  Negative for cough, choking, chest tightness, shortness of breath, wheezing and stridor.   Cardiovascular:  Negative for chest pain.  Gastrointestinal:  Negative for abdominal pain, diarrhea, nausea and vomiting.  Endocrine: Negative for cold intolerance and heat intolerance.  Genitourinary:  Negative for difficulty urinating.  Musculoskeletal:  Negative for arthralgias, back pain, gait problem, myalgias, neck pain and neck stiffness.  Skin:  Positive for color change and rash. Negative for pallor and wound.  Allergic/Immunologic: Positive for environmental allergies. Negative for food allergies.   Neurological:  Negative for dizziness, tremors, seizures, syncope, facial asymmetry, speech difficulty, weakness, light-headedness, numbness and headaches.  Hematological:  Negative for adenopathy. Does not bruise/bleed easily.  Psychiatric/Behavioral:  Negative for confusion, hallucinations and sleep disturbance. The patient is nervous/anxious.        Objective:   Physical Exam Vitals and nursing note reviewed.  Constitutional:      General: She is awake. She is not in acute distress.    Appearance: Normal appearance. She is well-developed, well-groomed and overweight. She is not ill-appearing, toxic-appearing or diaphoretic.  HENT:     Head: Normocephalic and atraumatic.     Jaw: There is normal jaw occlusion. No trismus or swelling.     Salivary Glands: Right salivary gland is not diffusely enlarged or tender. Left salivary gland is not diffusely enlarged or tender.     Right Ear: Hearing and external ear normal. No decreased hearing noted. No laceration, swelling or tenderness. A middle ear effusion is present. There is no impacted cerumen. No foreign body. No mastoid tenderness. No PE tube. No hemotympanum. Tympanic membrane is not injected, scarred, perforated, erythematous, retracted or bulging.     Left Ear: Hearing and external ear normal. No decreased hearing noted. Drainage and swelling present. No laceration or tenderness. There is no impacted cerumen. No foreign body. No mastoid tenderness. No PE tube. No hemotympanum. Tympanic membrane is erythematous and bulging. Tympanic membrane is not injected, scarred or perforated.     Ears:     Comments: Left TM with opaque white debris coating scattered erythema canal/TM periphery; left auditory canal swelling erythema    Nose: Nose normal. No congestion or rhinorrhea.     Right Turbinates: Not enlarged, swollen or pale.     Left  Turbinates: Not enlarged, swollen or pale.     Right Sinus: No maxillary sinus tenderness or frontal sinus  tenderness.     Left Sinus: No maxillary sinus tenderness or frontal sinus tenderness.     Mouth/Throat:     Lips: Pink. No lesions.     Mouth: Mucous membranes are moist. No lacerations, oral lesions or angioedema.     Dentition: No gum lesions.     Tongue: No lesions. Tongue does not deviate from midline.     Palate: No mass and lesions.     Pharynx: Uvula midline. Pharyngeal swelling and posterior oropharyngeal erythema present. No uvula swelling.     Tonsils: No tonsillar exudate. 0 on the right. 0 on the left.     Comments: Oropharynx cobblestoning; bilateral allergic shiners Eyes:     General: Lids are normal. Vision grossly intact. Gaze aligned appropriately. Allergic shiner present. No scleral icterus.       Right eye: No discharge.        Left eye: No discharge.     Extraocular Movements: Extraocular movements intact.     Right eye: Normal extraocular motion and no nystagmus.     Left eye: Normal extraocular motion and no nystagmus.     Conjunctiva/sclera: Conjunctivae normal.     Pupils: Pupils are equal, round, and reactive to light.  Neck:     Thyroid: No thyroid tenderness.     Trachea: Trachea and phonation normal. No tracheal tenderness, abnormal tracheal secretions or tracheal deviation.  Cardiovascular:     Rate and Rhythm: Regular rhythm. Tachycardia present.     Pulses:          Carotid pulses are 2+ on the right side and 2+ on the left side. Pulmonary:     Effort: Pulmonary effort is normal. No tachypnea, bradypnea, accessory muscle usage, respiratory distress or retractions.     Breath sounds: Normal breath sounds and air entry. No stridor or transmitted upper airway sounds. No decreased breath sounds or wheezing.     Comments: Spoke full sentences without difficulty; no cough observed in exam room Abdominal:     General: Abdomen is flat.  Musculoskeletal:        General: Normal range of motion.     Right hand: No swelling, deformity, lacerations or tenderness.  Normal range of motion. Normal strength. Normal capillary refill.     Left hand: No swelling, deformity, lacerations or tenderness. Normal range of motion. Normal strength. Normal capillary refill.     Cervical back: Normal range of motion and neck supple. No swelling, edema, deformity, erythema, signs of trauma, lacerations, rigidity, tenderness or crepitus. No pain with movement or muscular tenderness. Normal range of motion.     Thoracic back: No swelling, edema, deformity, signs of trauma, lacerations or tenderness. Normal range of motion.     Right lower leg: No edema.     Left lower leg: No edema.  Lymphadenopathy:     Head:     Right side of head: No submandibular or preauricular adenopathy.     Left side of head: No submandibular or preauricular adenopathy.     Cervical: No cervical adenopathy.     Right cervical: No superficial or posterior cervical adenopathy.    Left cervical: No superficial or posterior cervical adenopathy.  Skin:    General: Skin is warm and dry.     Capillary Refill: Capillary refill takes less than 2 seconds.     Coloration: Skin is not ashen, cyanotic,  jaundiced, mottled, pale or sallow.     Findings: Abrasion, erythema and rash present. No abscess, acne, bruising, burn, ecchymosis, signs of injury, laceration, lesion, petechiae or wound. Rash is macular, papular and urticarial. Rash is not crusting, nodular, purpuric, pustular, scaling or vesicular.     Nails: There is no clubbing.          Comments: Urticarial rash bilateral arms and abdomen; patient itching  after taking benadryl 50mg  po x 1; applied chemical ice pack to bilateral arms and abdomen x 2; linear abrasions from scratching anterior forearms noted in clinic; right posterior forearm scabbed nummular rash largest diameter 2cm grouped linearly superior to distal forearm clean and dry  Neurological:     General: No focal deficit present.     Mental Status: She is alert and oriented to person, place,  and time. Mental status is at baseline.     GCS: GCS eye subscore is 4. GCS verbal subscore is 5. GCS motor subscore is 6.     Cranial Nerves: Cranial nerves 2-12 are intact. No cranial nerve deficit, dysarthria or facial asymmetry.     Sensory: Sensation is intact.     Motor: Motor function is intact. No weakness, tremor, atrophy, abnormal muscle tone or seizure activity.     Coordination: Coordination is intact. Coordination normal.     Gait: Gait is intact. Gait normal.     Comments: In/out of chair without difficulty; gait sure and steady in clinic; bilateral hand grasp equal 5/5  Psychiatric:        Attention and Perception: Attention and perception normal.        Mood and Affect: Affect normal. Mood is anxious.        Speech: Speech normal.        Behavior: Behavior normal. Behavior is cooperative.        Thought Content: Thought content normal.        Cognition and Memory: Cognition and memory normal.        Judgment: Judgment normal.           Assessment & Plan:   A-Drug allergy antibiotic (augmentin), left acute otitis media  P-stop augmentin.  Patient returned bottle with 19 tabs/20.  Had taken first dose 30 minutes prior to arrival in clinic.  Had taken benadryl 25mg  po and additional Benadryl 25mg  po administered in clinic.  Patient denied history of tongue swelling/dyspnea with previous ingestion of amoxicillin from dentist but did feel like ear lobes swelling and itching started soles of feet/hands.  Ice packs applied to forearms/abdomen.  Given calagel 2% may apply QID prn itching and/or hydrocortisone 1% cream topical BID prn itching.  Ice topical 10 minutes prn itching.  Avoid steamy/hot baths or showers.  Start famotidine 10mg  po BID prn itching #30 RF0 may use either zyrtec 10mg  po BID prn itching or diphenhydramine 50-100mg  po q6h prn itching.  Electronic Rx sent to her pharmacy or choice or may purchase OTC whichever is cheaper for her.  Electronic Rx epipen 0.3mg  IM x  1 prn dyspnea/facial swelling #1 RF1 sent to her pharmacy of choice.  ER/911 if difficulty breathing/facial swelling.  Discussed other symptoms of allergic reaction can be nausea/vomiting/diarrhea in addition to trouble wheezing, tongue swelling, rash and dizziness.  Exitcare handout on drug allergy and otitis media printed and given to patient.  Patient monitored in clinic until 1345 symptoms improved with ice and benadryl. Patient planning to stop at her pharmacy on drive home to pick up  famotidine and start taking immediately.  Patient also planning to sit in cool bathtub water to help with itching.  Discussed scratching releases more histamines and worsens itching.  Discussed may continue ice or apply aquaphor.  Patient reported itching was most intense earlobes, soles of feet, arms and abdomen.   VSS tachycardia resolving drinking water without difficulty.  RN Reece Packer walked patient to her vehicle after notification to supervisor patient was leaving work due to medical problem.  HR Tonya notified patient requested to leave work due to side effects after started new antibiotic from [facility] today.  Patient notified that amoxicillin is a penicillin and she should notify all dentists/providers she is allergic/had intense itching rash after taking amoxicillin/augmentin.  Consider allergy testing/referral.  Patient verbalized understanding information/instructions, agreed with plan of care and had no further questions at this time.  Supportive treatment. Stop augmentin.  Start doxycycline 100mg  po BID x 10 days #20 RF0 dispensed from [facility] to patient  Tylenol 1000mg  po QID prn pain/fever.   No evidence of invasive bacterial infection, non toxic and well hydrated.  This is most likely self limiting viral infection.  I do not see where any further testing or imaging is necessary at this time.   I will suggest supportive care, rest, good hygiene and encourage the patient to take adequate fluids.  The patient is  to return to clinic or EMERGENCY ROOM if symptoms worsen or change significantly e.g. ear pain, fever, purulent discharge from ears or bleeding.  Exitcare handout on otitis media printed and given to patient   Patient verbalized agreement and understanding of treatment plan and had no further questions at this time.

## 2021-09-24 NOTE — Telephone Encounter (Signed)
Patient reported took 2 hours bath and feeling much better.  Line long at the pharmacy did not pick up Rx today.  Has benadryl at home if needed to take prn again tonight or in the morning.  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  Patient verbalized understanding information/instructions, agreed with plan of care and had no further questions at this time.  A&Ox3 spoke full sentences without difficulty.

## 2021-09-24 NOTE — Patient Instructions (Signed)
Drug Allergy  A drug allergy happens when the body's disease-fighting system (immune system) reacts badly to a medicine. Drug allergies range from mild to severe. A drug allergy is not the same as a medicine side effect, which is a known possible reaction to the drug. A drug allergy is also different from medicine toxicity caused by an overdose of the drug. The time of an allergic reaction varies. Symptoms often appear between 1 to 2 hours after taking the medicine; however, some allergic reactions occur 1 week or more after you are exposed to a medicine (delayed reaction). A sudden (acute), severe allergic reaction that affects multiple areas of the body is called an anaphylactic reaction (anaphylaxis). Anaphylaxis can be life-threatening. All allergic reactions to a medicine require medical evaluation, even if the allergic reaction appears to be mild. What are the causes? This condition is caused by the immune system wrongly identifying a medication as being harmful. When this happens, the body releases proteins (antibodies) and other compounds, such as histamine, into the bloodstream. This causes swelling in certain tissues and reduces blood flow to important areas, such as the heart and lungs. Almost any medicine can cause an allergic reaction. Medicines that commonly cause allergic reactions (common allergens) include: Antibiotics, such as penicillin. Sulfa medicines (sulfonamides). Medicines that numb certain areas of the body (local anesthetics). X-ray dyes that contain iodine. Pain-relievers. This includes aspirin and NSAIDs, such as ibuprofen or naproxen sodium. Chemotherapy drugs for treating cancer. Medicines for autoimmune diseases, such as rheumatoid arthritis. What are the signs or symptoms? Common symptoms of a mild allergic reaction include: Nasal congestion. Tingling in the mouth or tongue. An itchy, red rash. Common symptoms of a severe allergic reaction include: Swelling of the  face, eyes, lips, or tongue, including the back of the mouth and throat. Difficulty speaking (hoarseness) or swallowing, or making high-pitched whistling sounds, most often when you breathe out (wheezing). Itchy, red, swollen areas of skin (hives). Dizziness, light-headedness, or fainting. Anxiety or confusion. Chest tightness and fast or irregular heartbeats (palpitations). Abdominal pain, vomiting, or diarrhea. How is this diagnosed? This condition is diagnosed based on a physical exam and your history of recent exposure to one or more medicines. You may be referred for follow-up testing by a health care provider who specializes in allergies. This testing can confirm the diagnosis of a drug allergy and determine which medicines you are allergic to. Testing may include: Skin tests. These may involve: Injecting a small amount of the possible allergen between layers of your skin (intradermal injection). Applying patches to your skin. Blood tests. Drug challenge. For this test, a health care provider gives you a small amount of a medicine in gradual doses while watching for an allergic reaction. If you are unsure of what caused your allergic reaction, your health care provider may ask you for: Information about all medicines that you take on a regular basis. The date and time of your reaction. How is this treated? There is no cure for allergies. However, an allergic reaction can be treated with: Medicines that help: Reduce pain and swelling (NSAIDs). Relieve itching and hives (antihistamines). Reduce swelling (corticosteroids). Respiratory inhalers. These are inhaled medicines that help open (dilate) the airways in your lungs. Injections of medicine that helps to relax the muscles in your airways and tighten your blood vessels (epinephrine). Severe allergic reactions, such as anaphylaxis, require immediate treatment in a hospital. You may need to be hospitalized for observation. You may also  be prescribed rescue medicines,   such as epinephrine. Epinephrine comes in many forms, including what is commonly called an auto-injector "pen" (pre-filled automatic epinephrine injection device). Follow these instructions at home: If you have a severe allergy  Always keep an auto-injector pen or your anaphylaxis kit near you. This can be lifesaving if you have a severe reaction. Use your auto-injector pen or anaphylaxis kit as told by your health care provider. Make sure that you, the members of your household, and your employer know: How to use your auto-injector pen or anaphylaxis kit. How to use your auto-injector pen to give you an epinephrine injection. Replace your auto-injector pen or anaphylaxis kit immediately after use, in case you have another reaction. Wear a medical alert bracelet or necklace that states your drug allergy, if told by your health care provider. General instructions Avoid medicines that you are allergic to. Take over-the-counter and prescription medicines only as told by your health care provider. If you were given medicines to treat your allergic reaction, do not drive until your health care provider tells you it is safe. If you have hives or a rash: Use an over-the-counter antihistamine as told by your health care provider. Apply cold, wet cloths (cold compresses) to your skin or take baths or showers in cool water. Avoid hot water. If you had tests done, it is up to you to get your test results. Ask your health care provider when your results will be ready. Tell all your health care providers that you have a drug allergy. Keep all follow-up visits This is important. Contact a health care provider if: You think that you are having a mild allergic reaction. Symptoms of an allergic reaction usually start within 1 hour after you are exposed to a medicine. You have symptoms that last more than 2 days after your reaction. You develop new signs or symptoms. Get help  right away if: You needed to use epinephrine. An epinephrine injection helps to manage life-threatening allergic reactions, but you still need to go to the emergency room even if epinephrine seems to work. This is important because anaphylaxis may happen again within 72 hours (rebound anaphylaxis). If you used epinephrine to treat anaphylaxis outside of the hospital, you need additional medical care. This may include more doses of epinephrine. You develop signs or symptoms of a severe allergic reaction. These symptoms may represent a serious problem that is an emergency. Do not wait to see if the symptoms will go away. Use your auto-injector pen or anaphylaxis kit as you have been instructed, and get medical help right away. Call your local emergency services (911 in the [place] Do not drive yourself to the hospital. Summary A drug allergy happens when the body's disease-fighting system reacts badly to a medicine. Drug allergies range from mild to severe. In some cases, an allergic reaction may be life-threatening. If you have a severe allergy, always keep an auto-injector pen or your anaphylaxis kit near you. This information is not intended to replace advice given to you by your health care provider. Make sure you discuss any questions you have with your health care provider. Document Revised: 08/18/2020 Document Reviewed: 08/18/2020 Elsevier Patient Education  [place]. Otitis Media, Adult  Otitis media occurs when there is inflammation and fluid in the middle ear with signs and symptoms of an acute infection. The middle ear is a part of the ear that contains bones for hearing as well as air that helps send sounds to the brain. When infected fluid builds up in  this space, it causes pressure and can lead to an ear infection. The eustachian tube connects the middle ear to the back of the nose (nasopharynx) and normally allows air into the middle ear. If the eustachian tube becomes blocked,  fluid can build up and become infected. What are the causes? This condition is caused by a blockage in the eustachian tube. This can be caused by mucus or by swelling of the tube. Problems that can cause a blockage include: A cold or other upper respiratory infection. Allergies. An irritant, such as tobacco smoke. Enlarged adenoids. The adenoids are areas of soft tissue located high in the back of the throat, behind the nose and the roof of the mouth. They are part of the body's defense system (immune system). A mass in the nasopharynx. Damage to the ear caused by pressure changes (barotrauma). What increases the risk? You are more likely to develop this condition if you: Smoke or are exposed to tobacco smoke. Have an opening in the roof of your mouth (cleft palate). Have gastroesophageal reflux. Have an immune system disorder. What are the signs or symptoms? Symptoms of this condition include: Ear pain. Fever. Decreased hearing. Tiredness (lethargy). Fluid leaking from the ear, if the eardrum is ruptured or has burst. Ringing in the ear. How is this diagnosed?  This condition is diagnosed with a physical exam. During the exam, your health care provider will use an instrument called an otoscope to look in your ear and check for redness, swelling, and fluid. He or she will also ask about your symptoms. Your health care provider may also order tests, such as: A pneumatic otoscopy. This is a test to check the movement of the eardrum. It is done by squeezing a small amount of air into the ear. A tympanogram. This is a test that shows how well the eardrum moves in response to air pressure in the ear canal. It provides a graph for your health care provider to review. How is this treated? This condition can go away on its own within 3-5 days. But if the condition is caused by a bacterial infection and does not go away on its own, or if it keeps coming back, your health care provider  may: Prescribe antibiotic medicine to treat the infection. Prescribe or recommend medicines to control pain. Follow these instructions at home: Take over-the-counter and prescription medicines only as told by your health care provider. If you were prescribed an antibiotic medicine, take it as told by your health care provider. Do not stop taking the antibiotic even if you start to feel better. Keep all follow-up visits. This is important. Contact a health care provider if: You have bleeding from your nose. There is a lump on your neck. You are not feeling better in 5 days. You feel worse instead of better. Get help right away if: You have severe pain that is not controlled with medicine. You have swelling, redness, or pain around your ear. You have stiffness in your neck. A part of your face is not moving (paralyzed). The bone behind your ear (mastoid bone) is tender when you touch it. You develop a severe headache. Summary Otitis media is redness, soreness, and swelling of the middle ear, usually resulting in pain and decreased hearing. This condition can go away on its own within 3-5 days. If the problem does not go away in 3-5 days, your health care provider may give you medicines to treat the infection. If you were prescribed an antibiotic  medicine, take it as told by your health care provider. Follow all instructions that were given to you by your health care provider. This information is not intended to replace advice given to you by your health care provider. Make sure you discuss any questions you have with your health care provider. Document Revised: 06/16/2020 Document Reviewed: 06/16/2020 Elsevier Patient Education  [place].

## 2021-09-25 NOTE — Telephone Encounter (Signed)
RN Burna Mortimer followed up with patient today in [facility].  Rash and itching resolved.  Patient started doxycycline this am and tolerating without difficulty.  Skin warm dry and pink respirations even and unlabored and gait sure and steady per [facility].  Patient denied further concerns or needs at this time.

## 2021-10-06 ENCOUNTER — Encounter: Payer: Self-pay | Admitting: Registered Nurse

## 2021-10-06 ENCOUNTER — Ambulatory Visit: Payer: Self-pay | Admitting: Registered Nurse

## 2021-10-06 VITALS — BP 142/86 | HR 88 | Temp 97.8°F

## 2021-10-06 MED ORDER — SALINE SPRAY 0.65 % NA SOLN: 2.0000 | NASAL | 0 refills | Status: DC

## 2021-10-06 MED ORDER — AZITHROMYCIN 250 MG PO TABS: ORAL_TABLET | ORAL | 0 refills | Status: DC

## 2021-10-06 MED ORDER — OXYMETAZOLINE HCL 0.05 % NA SOLN: 1.0000 | Freq: Two times a day (BID) | NASAL | 0 refills | Status: AC

## 2021-10-06 NOTE — Patient Instructions (Signed)
Allergic Rhinitis, Adult  Allergic rhinitis is an allergic reaction that affects the mucous membrane inside the nose. The mucous membrane is the tissue that produces mucus. There are two types of allergic rhinitis: Seasonal. This type is also called hay fever and happens only during certain seasons. Perennial. This type can happen at any time of the year. Allergic rhinitis cannot be spread from person to person. This condition can be mild, moderate, or severe. It can develop at any age and may be outgrown. What are the causes? This condition is caused by allergens. These are things that can cause an allergic reaction. Allergens may differ for seasonal allergic rhinitis and perennial allergic rhinitis. Seasonal allergic rhinitis is triggered by pollen. Pollen can come from grasses, trees, and weeds. Perennial allergic rhinitis may be triggered by: Dust mites. Proteins in a pet's urine, saliva, or dander. Dander is dead skin cells from a pet. Smoke, mold, or car fumes. What increases the risk? You are more likely to develop this condition if you have a family history of allergies or other conditions related to allergies, including: Allergic conjunctivitis. This is inflammation of parts of the eyes and eyelids. Asthma. This condition affects the lungs and makes it hard to breathe. Atopic dermatitis or eczema. This is long term (chronic) inflammation of the skin. Food allergies. What are the signs or symptoms? Symptoms of this condition include: Sneezing or coughing. A stuffy nose (nasal congestion), itchy nose, or nasal discharge. Itchy eyes and tearing of the eyes. A feeling of mucus dripping down the back of your throat (postnasal drip). Trouble sleeping. Tiredness or fatigue. Headache. Sore throat. How is this diagnosed? This condition may be diagnosed with your symptoms, medical history, and physical exam. Your health care provider may check for related conditions, such  as: Asthma. Pink eye. This is eye inflammation caused by infection (conjunctivitis). Ear infection. Upper respiratory infection. This is an infection in the nose, throat, or upper airways. You may also have tests to find out which allergens trigger your symptoms. These may include skin tests or blood tests. How is this treated? There is no cure for this condition, but treatment can help control symptoms. Treatment may include: Taking medicines that block allergy symptoms, such as corticosteroids and antihistamines. Medicine may be given as a shot, nasal spray, or pill. Avoiding any allergens. Being exposed again and again to tiny amounts of allergens to help you build a defense against allergens (immunotherapy). This is done if other treatments have not helped. It may include: Allergy shots. These are injected medicines that have small amounts of allergen in them. Sublingual immunotherapy. This involves taking small doses of a medicine with allergen in it under your tongue. If these treatments do not work, your health care provider may prescribe newer, stronger medicines. Follow these instructions at home: Avoiding allergens Find out what you are allergic to and avoid those allergens. These are some things you can do to help avoid allergens: If you have perennial allergies: Replace carpet with wood, tile, or vinyl flooring. Carpet can trap dander and dust. Do not smoke. Do not allow smoking in your home. Change your heating and air conditioning filters at least once a month. If you have seasonal allergies, take these steps during allergy season: Keep windows closed as much as possible. Plan outdoor activities when pollen counts are lowest. Check pollen counts before you plan outdoor activities. When coming indoors, change clothing and shower before sitting on furniture or bedding. If you have a pet   in the house that produces allergens: Keep the pet out of the bedroom. Vacuum, sweep, and  dust regularly. General instructions Take over-the-counter and prescription medicines only as told by your health care provider. Drink enough fluid to keep your urine pale yellow. Keep all follow-up visits as told by your health care provider. This is important. Where to find more information American Academy of Allergy, Asthma & Immunology: www.aaaai.org Contact a health care provider if: You have a fever. You develop a cough that does not go away. You make whistling sounds when you breathe (wheeze). Your symptoms slow you down or stop you from doing your normal activities each day. Get help right away if: You have shortness of breath. This symptom may represent a serious problem that is an emergency. Do not wait to see if the symptom will go away. Get medical help right away. Call your local emergency services (911 in the [place] Do not drive yourself to the hospital. Summary Allergic rhinitis may be managed by taking medicines as directed and avoiding allergens. If you have seasonal allergies, keep windows closed as much as possible during allergy season. Contact your health care provider if you develop a fever or a cough that does not go away. This information is not intended to replace advice given to you by your health care provider. Make sure you discuss any questions you have with your health care provider. Document Revised: 04/27/2019 Document Reviewed: 03/06/2019 Elsevier Patient Education  [place]. How to Perform a Sinus Rinse A sinus rinse is a home treatment that is used to rinse your sinuses with a germ-free (sterile) mixture of salt and water (saline solution). Sinuses are air-filled spaces in your skull that are behind the bones of your face and forehead. They open into your nasal cavity. A sinus rinse can help to clear mucus, dirt, dust, or pollen from your nasal cavity. You may do a sinus rinse when you have a cold, a virus, nasal allergy symptoms, a sinus  infection, or stuffiness in your nose or sinuses. What are the risks? A sinus rinse is generally safe and effective. However, there are a few risks, which include: A burning sensation in your sinuses. This may happen if you do not make the saline solution as directed. Be sure to follow all directions when making the saline solution. Nasal irritation. Infection. This may be from unclean supplies or from contaminated water. Infection from contaminated water is rare, but possible. Do not do a sinus rinse if you have had ear or nasal surgery, ear infection, or plugged ears, unless recommended by your health care provider. Supplies needed: Saline solution or powder. Distilled or sterile water to mix with saline powder. You may use boiled and cooled tap water. Boil tap water for 5 minutes; cool until it is lukewarm. Use within 24 hours. Do not use regular tap water to mix with the saline solution. Neti pot or nasal rinse bottle. These supplies release the saline solution into your nose and through your sinuses. Neti pots and nasal rinse bottles can be purchased at [employer], a health food store, or online. How to perform a sinus rinse  Wash your hands with soap and water for at least 20 seconds. If soap and water are not available, use hand sanitizer. Wash your device according to the directions that came with the product and then dry it. Use the solution that comes with your product or one that is sold separately in stores. Follow the mixing  directions on the package to mix with sterile or distilled water. Fill the device with the amount of saline solution noted in the device instructions. Stand by a sink and tilt your head sideways over the sink. Place the spout of the device in your upper nostril (the one closer to the ceiling). Gently pour or squeeze the saline solution into your nasal cavity. The liquid should drain out from the lower nostril if you are not too congested. While  rinsing, breathe through your open mouth. Gently blow your nose to clear any mucus and rinse solution. Blowing too hard may cause ear pain. Turn your head in the other direction and repeat in your other nostril. Clean and rinse your device with clean water and then air-dry it. Talk with your health care provider or pharmacist if you have questions about how to do a sinus rinse. Summary A sinus rinse is a home treatment that is used to rinse your sinuses with a sterile mixture of salt and water (saline solution). You may do a sinus rinse when you have a cold, a virus, nasal allergy symptoms, a sinus infection, or stuffiness in your nose or sinuses. A sinus rinse is generally safe and effective. Follow all instructions carefully. This information is not intended to replace advice given to you by your health care provider. Make sure you discuss any questions you have with your health care provider. Document Revised: 08/25/2020 Document Reviewed: 08/25/2020 Elsevier Patient Education  2023 [employer]. Eustachian Tube Dysfunction  Eustachian tube dysfunction refers to a condition in which a blockage develops in the narrow passage that connects the middle ear to the back of the nose (eustachian tube). The eustachian tube regulates air pressure in the middle ear by letting air move between the ear and nose. It also helps to drain fluid from the middle ear space. Eustachian tube dysfunction can affect one or both ears. When the eustachian tube does not function properly, air pressure, fluid, or both can build up in the middle ear. What are the causes? This condition occurs when the eustachian tube becomes blocked or cannot open normally. Common causes of this condition include: Ear infections. Colds and other infections that affect the nose, mouth, and throat (upper respiratory tract). Allergies. Irritation from cigarette smoke. Irritation from stomach acid coming up into the esophagus  (gastroesophageal reflux). The esophagus is the part of the body that moves food from the mouth to the stomach. Sudden changes in air pressure, such as from descending in an airplane or scuba diving. Abnormal growths in the nose or throat, such as: Growths that line the nose (nasal polyps). Abnormal growth of cells (tumors). Enlarged tissue at the back of the throat (adenoids). What increases the risk? You are more likely to develop this condition if: You smoke. You are overweight. You are a child who has: Certain birth defects of the mouth, such as cleft palate. Large tonsils or adenoids. What are the signs or symptoms? Common symptoms of this condition include: A feeling of fullness in the ear. Ear pain. Clicking or popping noises in the ear. Ringing in the ear (tinnitus). Hearing loss. Loss of balance. Dizziness. Symptoms may get worse when the air pressure around you changes, such as when you travel to an area of high elevation, fly on an airplane, or go scuba diving. How is this diagnosed? This condition may be diagnosed based on: Your symptoms. A physical exam of your ears, nose, and throat. Tests, such as those that measure: The  movement of your eardrum. Your hearing (audiometry). How is this treated? Treatment depends on the cause and severity of your condition. In mild cases, you may relieve your symptoms by moving air into your ears. This is called "popping the ears." In more severe cases, or if you have symptoms of fluid in your ears, treatment may include: Medicines to relieve congestion (decongestants). Medicines that treat allergies (antihistamines). Nasal sprays or ear drops that contain medicines that reduce swelling (steroids). A procedure to drain the fluid in your eardrum. In this procedure, a small tube may be placed in the eardrum to: Drain the fluid. Restore the air in the middle ear space. A procedure to insert a balloon device through the nose to  inflate the opening of the eustachian tube (balloon dilation). Follow these instructions at home: Lifestyle Do not do any of the following until your health care provider approves: Travel to high altitudes. Fly in airplanes. Work in a Estate agent or room. Scuba dive. Do not use any products that contain nicotine or tobacco. These products include cigarettes, chewing tobacco, and vaping devices, such as e-cigarettes. If you need help quitting, ask your health care provider. Keep your ears dry. Wear fitted earplugs during showering and bathing. Dry your ears completely after. General instructions Take over-the-counter and prescription medicines only as told by your health care provider. Use techniques to help pop your ears as recommended by your health care provider. These may include: Chewing gum. Yawning. Frequent, forceful swallowing. Closing your mouth, holding your nose closed, and gently blowing as if you are trying to blow air out of your nose. Keep all follow-up visits. This is important. Contact a health care provider if: Your symptoms do not go away after treatment. Your symptoms come back after treatment. You are unable to pop your ears. You have: A fever. Pain in your ear. Pain in your head or neck. Fluid draining from your ear. Your hearing suddenly changes. You become very dizzy. You lose your balance. Get help right away if: You have a sudden, severe increase in any of your symptoms. Summary Eustachian tube dysfunction refers to a condition in which a blockage develops in the eustachian tube. It can be caused by ear infections, allergies, inhaled irritants, or abnormal growths in the nose or throat. Symptoms may include ear pain or fullness, hearing loss, or ringing in the ears. Mild cases are treated with techniques to unblock the ears, such as yawning or chewing gum. More severe cases are treated with medicines or procedures. This information is not intended  to replace advice given to you by your health care provider. Make sure you discuss any questions you have with your health care provider. Document Revised: 05/19/2020 Document Reviewed: 05/19/2020 Elsevier Patient Education  2023 [employer] Inc. Otitis Media, Adult  Otitis media occurs when there is inflammation and fluid in the middle ear with signs and symptoms of an acute infection. The middle ear is a part of the ear that contains bones for hearing as well as air that helps send sounds to the brain. When infected fluid builds up in this space, it causes pressure and can lead to an ear infection. The eustachian tube connects the middle ear to the back of the nose (nasopharynx) and normally allows air into the middle ear. If the eustachian tube becomes blocked, fluid can build up and become infected. What are the causes? This condition is caused by a blockage in the eustachian tube. This can be caused by mucus or  by swelling of the tube. Problems that can cause a blockage include: A cold or other upper respiratory infection. Allergies. An irritant, such as tobacco smoke. Enlarged adenoids. The adenoids are areas of soft tissue located high in the back of the throat, behind the nose and the roof of the mouth. They are part of the body's defense system (immune system). A mass in the nasopharynx. Damage to the ear caused by pressure changes (barotrauma). What increases the risk? You are more likely to develop this condition if you: Smoke or are exposed to tobacco smoke. Have an opening in the roof of your mouth (cleft palate). Have gastroesophageal reflux. Have an immune system disorder. What are the signs or symptoms? Symptoms of this condition include: Ear pain. Fever. Decreased hearing. Tiredness (lethargy). Fluid leaking from the ear, if the eardrum is ruptured or has burst. Ringing in the ear. How is this diagnosed?  This condition is diagnosed with a physical exam. During the exam,  your health care provider will use an instrument called an otoscope to look in your ear and check for redness, swelling, and fluid. He or she will also ask about your symptoms. Your health care provider may also order tests, such as: A pneumatic otoscopy. This is a test to check the movement of the eardrum. It is done by squeezing a small amount of air into the ear. A tympanogram. This is a test that shows how well the eardrum moves in response to air pressure in the ear canal. It provides a graph for your health care provider to review. How is this treated? This condition can go away on its own within 3-5 days. But if the condition is caused by a bacterial infection and does not go away on its own, or if it keeps coming back, your health care provider may: Prescribe antibiotic medicine to treat the infection. Prescribe or recommend medicines to control pain. Follow these instructions at home: Take over-the-counter and prescription medicines only as told by your health care provider. If you were prescribed an antibiotic medicine, take it as told by your health care provider. Do not stop taking the antibiotic even if you start to feel better. Keep all follow-up visits. This is important. Contact a health care provider if: You have bleeding from your nose. There is a lump on your neck. You are not feeling better in 5 days. You feel worse instead of better. Get help right away if: You have severe pain that is not controlled with medicine. You have swelling, redness, or pain around your ear. You have stiffness in your neck. A part of your face is not moving (paralyzed). The bone behind your ear (mastoid bone) is tender when you touch it. You develop a severe headache. Summary Otitis media is redness, soreness, and swelling of the middle ear, usually resulting in pain and decreased hearing. This condition can go away on its own within 3-5 days. If the problem does not go away in 3-5 days, your  health care provider may give you medicines to treat the infection. If you were prescribed an antibiotic medicine, take it as told by your health care provider. Follow all instructions that were given to you by your health care provider. This information is not intended to replace advice given to you by your health care provider. Make sure you discuss any questions you have with your health care provider. Document Revised: 06/16/2020 Document Reviewed: 06/16/2020 Elsevier Patient Education  2023 [employer].

## 2021-10-06 NOTE — Progress Notes (Signed)
Subjective:    Patient ID: Kimberly Rowland, female    DOB: Mar 27, 1980, 41 y.o.   MRN: [id number]  40y/o caucasian female established patient last seen for otitis media 09/24/21 completed course of doxycycline 100mg  po BID.  allergic to augmentin?amoxicillin newly documented.  Patient bought ear camera from looked in ears again and still having  redness and white drainage this week denied pain.  Still some muffled hearing.  Scheduled to fly on Thursday 20 Jul  Reviewed RN Note in paper chart [facility]  Some nasal congestion noted this week starting her sinus pills today also.      Review of Systems  Constitutional:  Negative for activity change, appetite change, chills, diaphoresis, fatigue and fever.  HENT:  Positive for congestion, ear discharge, hearing loss and postnasal drip. Negative for drooling, ear pain, facial swelling, mouth sores, nosebleeds, sinus pressure, sinus pain, sore throat, tinnitus, trouble swallowing and voice change.   Eyes:  Negative for photophobia, pain, discharge, redness, itching and visual disturbance.  Respiratory:  Negative for cough, choking, chest tightness, shortness of breath, wheezing and stridor.   Cardiovascular:  Negative for chest pain.  Gastrointestinal:  Negative for abdominal distention, abdominal pain, diarrhea, nausea and vomiting.  Endocrine: Negative for cold intolerance and heat intolerance.  Genitourinary:  Negative for difficulty urinating.  Musculoskeletal:  Negative for gait problem, neck pain and neck stiffness.  Skin:  Negative for color change, pallor, rash and wound.  Allergic/Immunologic: Positive for environmental allergies. Negative for food allergies.  Neurological:  Negative for dizziness, tremors, seizures, syncope, facial asymmetry, speech difficulty, weakness, light-headedness, numbness and headaches.  Hematological:  Negative for adenopathy. Does not bruise/bleed easily.  Psychiatric/Behavioral:  Negative for  agitation, confusion and sleep disturbance.        Objective:   Physical Exam Vitals and nursing note reviewed.  Constitutional:      General: She is awake. She is not in acute distress.    Appearance: Normal appearance. She is well-developed, well-groomed and overweight. She is not ill-appearing, toxic-appearing or diaphoretic.  HENT:     Head: Normocephalic and atraumatic.     Jaw: There is normal jaw occlusion. No trismus, swelling, pain on movement or malocclusion.     Salivary Glands: Right salivary gland is not diffusely enlarged or tender. Left salivary gland is not diffusely enlarged or tender.     Right Ear: Hearing, ear canal and external ear normal. No decreased hearing noted. Drainage and swelling present. No laceration or tenderness. A middle ear effusion is present. There is no impacted cerumen. No foreign body. No mastoid tenderness. No PE tube. No hemotympanum. Tympanic membrane is erythematous and bulging. Tympanic membrane is not retracted.     Left Ear: Hearing, ear canal and external ear normal. No decreased hearing noted. Drainage and swelling present. No laceration or tenderness. A middle ear effusion is present. There is no impacted cerumen. No foreign body. No mastoid tenderness. No PE tube. No hemotympanum. Tympanic membrane is erythematous and bulging. Tympanic membrane is not retracted.     Ears:     Comments: Left TM centrally opaque white drainage and erythema TM air fluid level noted; right TM erythema air fluid level clear and debris in canal    Nose: Mucosal edema, congestion and rhinorrhea present. No nasal deformity, septal deviation or laceration. Rhinorrhea is clear.     Right Turbinates: Enlarged and swollen. Not pale.     Left Turbinates: Enlarged and swollen. Not pale.  Right Sinus: No maxillary sinus tenderness or frontal sinus tenderness.     Left Sinus: No maxillary sinus tenderness or frontal sinus tenderness.     Mouth/Throat:     Lips: Pink. No  lesions.     Mouth: Mucous membranes are moist. Mucous membranes are not pale, not dry and not cyanotic. No lacerations, oral lesions or angioedema.     Dentition: No dental abscesses or gum lesions.     Tongue: No lesions. Tongue does not deviate from midline.     Palate: No mass and lesions.     Pharynx: Uvula midline. Pharyngeal swelling and posterior oropharyngeal erythema present. No oropharyngeal exudate or uvula swelling.     Tonsils: No tonsillar exudate or tonsillar abscesses. 0 on the right. 0 on the left.     Comments: Cobblestoning posterior pharynx; bilateral allergic shiners; nasal turbinates edema/erythema clear discharge Eyes:     General: Lids are normal. Vision grossly intact. Gaze aligned appropriately. Allergic shiner present. No scleral icterus.       Right eye: No foreign body, discharge or hordeolum.        Left eye: No foreign body, discharge or hordeolum.     Extraocular Movements:     Right eye: Normal extraocular motion and no nystagmus.     Left eye: Normal extraocular motion and no nystagmus.     Conjunctiva/sclera: Conjunctivae normal.     Right eye: Right conjunctiva is not injected. No chemosis, exudate or hemorrhage.    Left eye: Left conjunctiva is not injected. No chemosis, exudate or hemorrhage.    Pupils: Pupils are equal, round, and reactive to light. Pupils are equal.     Right eye: Pupil is round and reactive.     Left eye: Pupil is round and reactive.  Neck:     Thyroid: No thyroid mass or thyromegaly.     Trachea: Trachea and phonation normal. No tracheal tenderness or tracheal deviation.  Cardiovascular:     Rate and Rhythm: Normal rate and regular rhythm.     Pulses:          Radial pulses are 2+ on the right side and 2+ on the left side.  Pulmonary:     Effort: Pulmonary effort is normal. No accessory muscle usage or respiratory distress.     Breath sounds: Normal breath sounds and air entry. No stridor. No decreased breath sounds, wheezing,  rhonchi or rales.     Comments: No cough observed in exam room; spoke full sentences without difficulty Chest:     Chest wall: No tenderness.  Abdominal:     General: There is no distension.     Palpations: Abdomen is soft.  Musculoskeletal:        General: No tenderness. Normal range of motion.     Right shoulder: No swelling, deformity, effusion, laceration or crepitus. Normal range of motion. Normal strength.     Left shoulder: No deformity, effusion, laceration or crepitus. Normal range of motion. Normal strength.     Right elbow: No swelling, deformity, effusion or lacerations. Normal range of motion.     Left elbow: No swelling, deformity, effusion or lacerations. Normal range of motion.     Right hand: No swelling, deformity or lacerations. Normal range of motion. Normal strength.     Left hand: No swelling, deformity or lacerations. Normal range of motion. Normal strength.     Cervical back: Normal range of motion and neck supple. No swelling, edema, deformity, erythema, signs of trauma,  lacerations, rigidity, spasms, tenderness or crepitus. No pain with movement or muscular tenderness. Normal range of motion.     Thoracic back: No swelling, edema, deformity, signs of trauma, lacerations, spasms or tenderness. Normal range of motion.     Right hip: Normal.     Left hip: Normal.     Right knee: Normal.     Left knee: Normal.     Right lower leg: No edema.     Left lower leg: No edema.  Lymphadenopathy:     Head:     Right side of head: No submental, submandibular, tonsillar, preauricular, posterior auricular or occipital adenopathy.     Left side of head: No submental, submandibular, tonsillar, preauricular, posterior auricular or occipital adenopathy.     Cervical: No cervical adenopathy.     Right cervical: No superficial, deep or posterior cervical adenopathy.    Left cervical: No superficial, deep or posterior cervical adenopathy.  Skin:    General: Skin is warm and dry.      Capillary Refill: Capillary refill takes less than 2 seconds.     Coloration: Skin is not ashen, cyanotic, jaundiced, mottled, pale or sallow.     Findings: No abrasion, abscess, acne, bruising, burn, ecchymosis, erythema, signs of injury, laceration, lesion, petechiae, rash or wound.     Nails: There is no clubbing.  Neurological:     General: No focal deficit present.     Mental Status: She is alert and oriented to person, place, and time. Mental status is at baseline. She is not disoriented.     GCS: GCS eye subscore is 4. GCS verbal subscore is 5. GCS motor subscore is 6.     Cranial Nerves: Cranial nerves 2-12 are intact. No cranial nerve deficit, dysarthria or facial asymmetry.     Sensory: Sensation is intact. No sensory deficit.     Motor: Motor function is intact. No weakness, tremor, atrophy, abnormal muscle tone or seizure activity.     Coordination: Coordination is intact. Coordination normal.     Gait: Gait is intact. Gait normal.     Comments: In/out of chair and on/off exam table without difficulty; gait sure and steady in clinic; bilateral hand grasp equal 5/5  Psychiatric:        Attention and Perception: Attention and perception normal.        Mood and Affect: Mood and affect normal.        Speech: Speech normal.        Behavior: Behavior normal. Behavior is cooperative.        Thought Content: Thought content normal.        Cognition and Memory: Cognition and memory normal.        Judgment: Judgment normal.           Assessment & Plan:   A-bilateral otitis media acute  P-Allergic to amoxicillin (?augmentin) failed doxycycline 100mg  po BID x 10 days course.  Azithromycin 250mg  po take 2 day 1 then take 1 days 2-5 #6 RF0 dispensed from [facility] to patient.  Discussed continue her sinus pills for congestion for next 10 days.  Consider nasal saline 2 sprays each nares q2h wa prn congestion/after being outside due to poor air quality.  Consider wearing mask when outside.   Shower after work/before going to bed.  Start afrin night prior to air flight 1 spray each nostril BID OTC (2 doses)  then stop and restart again prior to flight home.  Discussed clear ears early and  often on departure/take off and descent/landing.  Keep afrin and nasal saline in her carry on bag in case she needs to use during flight if difficulty clearing ears in flight.   Discussed rebound swelling possible with prolonged use as mechanism of action blood vessel constriction.  May use nasal saline as much as she needs no restrictions.  May use tylenol 1000mg  po q6h prn pain or ibuprofen 800mg  po q8h prn pain.  Patient denied pain at this time.  Follow up re-evaluation new or worsening symptoms consider ear drops/ENT referral if no improvement with oral medications.  Patient verbalized understanding information/instructions, agreed with plan of care and had no further questions at this time.

## 2021-11-25 ENCOUNTER — Other Ambulatory Visit: Payer: Self-pay

## 2021-11-25 ENCOUNTER — Other Ambulatory Visit (HOSPITAL_COMMUNITY): Payer: Self-pay

## 2021-11-25 MED ORDER — DEXTROAMPHETAMINE SULFATE 10 MG PO TABS
20.0000 mg | ORAL_TABLET | Freq: Three times a day (TID) | ORAL | 0 refills | Status: DC
  Filled 2021-11-27: qty 180, 30d supply, fill #0

## 2021-11-26 ENCOUNTER — Other Ambulatory Visit (HOSPITAL_COMMUNITY): Payer: Self-pay

## 2021-11-26 ENCOUNTER — Other Ambulatory Visit: Payer: Self-pay

## 2021-11-27 ENCOUNTER — Other Ambulatory Visit (HOSPITAL_COMMUNITY): Payer: Self-pay

## 2021-11-27 ENCOUNTER — Other Ambulatory Visit: Payer: Self-pay

## 2021-11-27 MED ORDER — DEXTROAMPHETAMINE SULFATE 10 MG PO TABS
ORAL_TABLET | ORAL | 0 refills | Status: DC
  Filled 2021-11-27: qty 180, 30d supply, fill #0

## 2021-11-30 ENCOUNTER — Other Ambulatory Visit: Payer: Self-pay

## 2021-12-11 ENCOUNTER — Ambulatory Visit: Payer: Self-pay

## 2021-12-11 DIAGNOSIS — Z23 Encounter for immunization: Secondary | ICD-10-CM

## 2021-12-24 ENCOUNTER — Other Ambulatory Visit (HOSPITAL_COMMUNITY): Payer: Self-pay

## 2021-12-24 MED ORDER — DEXTROAMPHETAMINE SULFATE 10 MG PO TABS
ORAL_TABLET | ORAL | 0 refills | Status: DC
  Filled 2021-12-28: qty 180, 30d supply, fill #0

## 2021-12-28 ENCOUNTER — Other Ambulatory Visit (HOSPITAL_COMMUNITY): Payer: Self-pay

## 2022-01-26 ENCOUNTER — Other Ambulatory Visit (HOSPITAL_COMMUNITY): Payer: Self-pay

## 2022-01-27 ENCOUNTER — Other Ambulatory Visit (HOSPITAL_COMMUNITY): Payer: Self-pay

## 2022-01-27 MED ORDER — DEXTROAMPHETAMINE SULFATE 10 MG PO TABS
ORAL_TABLET | ORAL | 0 refills | Status: DC
  Filled 2022-01-27: qty 180, 30d supply, fill #0

## 2022-01-28 ENCOUNTER — Other Ambulatory Visit (HOSPITAL_COMMUNITY): Payer: Self-pay

## 2022-02-26 ENCOUNTER — Other Ambulatory Visit (HOSPITAL_COMMUNITY): Payer: Self-pay

## 2022-02-26 MED ORDER — DEXTROAMPHETAMINE SULFATE 10 MG PO TABS
20.0000 mg | ORAL_TABLET | Freq: Three times a day (TID) | ORAL | 0 refills | Status: DC
  Filled 2022-02-26: qty 180, 30d supply, fill #0

## 2022-03-26 ENCOUNTER — Other Ambulatory Visit (HOSPITAL_COMMUNITY): Payer: Self-pay

## 2022-03-26 MED ORDER — DEXTROAMPHETAMINE SULFATE 10 MG PO TABS
20.0000 mg | ORAL_TABLET | Freq: Three times a day (TID) | ORAL | 0 refills | Status: DC
  Filled 2022-03-29: qty 180, 30d supply, fill #0

## 2022-03-29 ENCOUNTER — Other Ambulatory Visit (HOSPITAL_COMMUNITY): Payer: Self-pay

## 2022-04-08 ENCOUNTER — Other Ambulatory Visit (HOSPITAL_COMMUNITY): Payer: Self-pay

## 2022-04-08 MED ORDER — CLINDAMYCIN HCL 300 MG PO CAPS
300.0000 mg | ORAL_CAPSULE | Freq: Four times a day (QID) | ORAL | 0 refills | Status: DC
  Filled 2022-04-08: qty 28, 7d supply, fill #0

## 2022-04-27 ENCOUNTER — Other Ambulatory Visit: Payer: Self-pay

## 2022-04-27 ENCOUNTER — Other Ambulatory Visit (HOSPITAL_COMMUNITY): Payer: Self-pay

## 2022-04-27 MED ORDER — DEXTROAMPHETAMINE SULFATE 10 MG PO TABS
20.0000 mg | ORAL_TABLET | Freq: Three times a day (TID) | ORAL | 0 refills | Status: DC
  Filled 2022-04-28: qty 180, 30d supply, fill #0

## 2022-04-28 ENCOUNTER — Other Ambulatory Visit (HOSPITAL_COMMUNITY): Payer: Self-pay

## 2022-04-29 ENCOUNTER — Ambulatory Visit: Payer: Self-pay | Admitting: Occupational Medicine

## 2022-04-29 DIAGNOSIS — Z0000 Encounter for general adult medical examination without abnormal findings: Secondary | ICD-10-CM

## 2022-04-29 DIAGNOSIS — H5789 Other specified disorders of eye and adnexa: Secondary | ICD-10-CM

## 2022-04-29 DIAGNOSIS — S00219A Abrasion of unspecified eyelid and periocular area, initial encounter: Secondary | ICD-10-CM

## 2022-04-29 NOTE — Progress Notes (Signed)
Patient scratch left eye noted some redness and slight blood. Given refresh eye drops and ice pack to apply. Denies any pain. Patient given smoke cessation video and question.

## 2022-05-03 ENCOUNTER — Ambulatory Visit: Payer: Self-pay | Admitting: Occupational Medicine

## 2022-05-03 DIAGNOSIS — S00219A Abrasion of unspecified eyelid and periocular area, initial encounter: Secondary | ICD-10-CM

## 2022-05-03 DIAGNOSIS — H5789 Other specified disorders of eye and adnexa: Secondary | ICD-10-CM

## 2022-05-03 NOTE — Progress Notes (Signed)
Eye completley resolved.

## 2022-05-19 ENCOUNTER — Telehealth: Payer: Self-pay | Admitting: Registered Nurse

## 2022-05-19 ENCOUNTER — Encounter: Payer: Self-pay | Admitting: Registered Nurse

## 2022-05-19 DIAGNOSIS — Z0000 Encounter for general adult medical examination without abnormal findings: Secondary | ICD-10-CM

## 2022-05-19 DIAGNOSIS — E559 Vitamin D deficiency, unspecified: Secondary | ICD-10-CM

## 2022-05-19 DIAGNOSIS — R7303 Prediabetes: Secondary | ICD-10-CM

## 2022-05-19 NOTE — Telephone Encounter (Signed)
Last labs 2022.  [facility] Well 2025 appt orders fasting labs entered for patient female executive panel, Hgba1c and vitamin D.  RN [place] notified.  RN Kimrey notified me patient completed smoking cessation video and quiz today for employer/medcost insurance which is part of [facility] Well appt.

## 2022-05-20 ENCOUNTER — Other Ambulatory Visit: Payer: Self-pay | Admitting: Occupational Medicine

## 2022-05-20 DIAGNOSIS — Z0000 Encounter for general adult medical examination without abnormal findings: Secondary | ICD-10-CM

## 2022-05-20 NOTE — Progress Notes (Signed)
Lab drawn tolerated well no issues noted.

## 2022-05-21 LAB — CMP12+LP+TP+TSH+6AC+CBC/D/PLT
ALT: 17 IU/L (ref 0–32)
AST: 16 IU/L (ref 0–40)
Albumin/Globulin Ratio: 2 (ref 1.2–2.2)
Albumin: 4.3 g/dL (ref 3.9–4.9)
Alkaline Phosphatase: 91 IU/L (ref 44–121)
BUN/Creatinine Ratio: 14 (ref 9–23)
BUN: 12 mg/dL (ref 6–24)
Basophils Absolute: 0.1 10*3/uL (ref 0.0–0.2)
Basos: 1 %
Bilirubin Total: 0.2 mg/dL (ref 0.0–1.2)
Calcium: 8.9 mg/dL (ref 8.7–10.2)
Chloride: 103 mmol/L (ref 96–106)
Chol/HDL Ratio: 3.3 ratio (ref 0.0–4.4)
Cholesterol, Total: 186 mg/dL (ref 100–199)
Creatinine, Ser: 0.86 mg/dL (ref 0.57–1.00)
EOS (ABSOLUTE): 0.1 10*3/uL (ref 0.0–0.4)
Eos: 2 %
Estimated CHD Risk: <0.5 times avg. (ref 0.0–1.0)
Free Thyroxine Index: 1.8 (ref 1.2–4.9)
GGT: 18 IU/L (ref 0–60)
Globulin, Total: 2.2 g/dL (ref 1.5–4.5)
Glucose: 92 mg/dL (ref 70–99)
HDL: 57 mg/dL (ref >39)
Hematocrit: 42.9 % (ref 34.0–46.6)
Hemoglobin: 13.6 g/dL (ref 11.1–15.9)
Immature Grans (Abs): 0 10*3/uL (ref 0.0–0.1)
Immature Granulocytes: 1 %
Iron: 81 ug/dL (ref 27–159)
LDH: 196 IU/L (ref 119–226)
LDL Chol Calc (NIH): 110 mg/dL — ABNORMAL HIGH (ref 0–99)
Lymphocytes Absolute: 2.4 10*3/uL (ref 0.7–3.1)
Lymphs: 30 %
MCH: 30.2 pg (ref 26.6–33.0)
MCHC: 31.7 g/dL (ref 31.5–35.7)
MCV: 95 fL (ref 79–97)
Monocytes Absolute: 0.5 10*3/uL (ref 0.1–0.9)
Monocytes: 6 %
Neutrophils Absolute: 4.7 10*3/uL (ref 1.4–7.0)
Neutrophils: 60 %
Phosphorus: 3 mg/dL (ref 3.0–4.3)
Platelets: 408 10*3/uL (ref 150–450)
Potassium: 4.4 mmol/L (ref 3.5–5.2)
RBC: 4.51 x10E6/uL (ref 3.77–5.28)
RDW: 12.5 % (ref 11.7–15.4)
Sodium: 140 mmol/L (ref 134–144)
T3 Uptake Ratio: 28 % (ref 24–39)
T4, Total: 6.5 ug/dL (ref 4.5–12.0)
TSH: 2.86 u[IU]/mL (ref 0.450–4.500)
Total Protein: 6.5 g/dL (ref 6.0–8.5)
Triglycerides: 108 mg/dL (ref 0–149)
Uric Acid: 3.3 mg/dL (ref 2.6–6.2)
VLDL Cholesterol Cal: 19 mg/dL (ref 5–40)
WBC: 7.8 10*3/uL (ref 3.4–10.8)
eGFR: 87 mL/min/{1.73_m2} (ref >59)

## 2022-05-21 LAB — VITAMIN D 25 HYDROXY (VIT D DEFICIENCY, FRACTURES): Vit D, 25-Hydroxy: 20 ng/mL — ABNORMAL LOW (ref 30.0–100.0)

## 2022-05-21 LAB — HEMOGLOBIN A1C
Est. average glucose Bld gHb Est-mCnc: 120 mg/dL
Hgb A1c MFr Bld: 5.8 % — ABNORMAL HIGH (ref 4.8–5.6)

## 2022-05-22 ENCOUNTER — Encounter: Payer: Self-pay | Admitting: Registered Nurse

## 2022-05-22 DIAGNOSIS — E559 Vitamin D deficiency, unspecified: Secondary | ICD-10-CM | POA: Insufficient documentation

## 2022-05-22 DIAGNOSIS — R7303 Prediabetes: Secondary | ICD-10-CM | POA: Insufficient documentation

## 2022-05-22 MED ORDER — CHOLECALCIFEROL 1.25 MG (50000 UT) PO TABS: 1.0000 | ORAL_TABLET | ORAL | 1 refills | Status: AC

## 2022-05-22 MED ORDER — CHOLECALCIFEROL 1.25 MG (50000 UT) PO TABS: 1.0000 | ORAL_TABLET | ORAL | 1 refills | Status: DC

## 2022-05-22 NOTE — Addendum Note (Signed)
Addended by: Gerarda Fraction A on: 05/22/2022 06:58 PM   Modules accepted: Orders

## 2022-05-22 NOTE — Progress Notes (Signed)
My chart message sent to patient Kimberly Rowland, Your LDL cholesterol was elevated along with 3 month blood sugar (HgbA1c).  Your vitamin D level was low.  Follow up with PCM elevated cholesterol, blood sugar and low Vitamin D.  [facility] handouts on vitamin D deficiency, prediabetes, prediabetes diet, calorie counting for weight loss, cholesterol, high fiber diet and [employer] dietitian information sent to your my chart account.  The dietitian offers free visits. Link to make appt  [facility] ([email or web address]) I recommend 15 minutes sunlight on skin daily and start cholecalciferol 50,000 units weekly take with fattiest meal for best absorption and repeat level in 6 months not fasting.I sent Rx to your pharmacy [facility] . Repeat all other labs in 1 year except the following sooner: Weight, BP (blood pressure) check and Hgab1c nonfasting in 3 months also.   I recommend weight loss 5-10 lbs, exercise 150 minutes per week; 20 grams women per up to date; eat whole grains/fruits/vegetables; keep added sugars to less than 100 calories/ 5 teaspoons for women per American Heart Association;  electrolytes, iron, kidney/liver/thyroid function and complete blood count normal. If you prefer printed copies of results and/or handouts please see RN Kimrey.  Please let us know if you have further questions or concerns. Please see RN Evlyn Kanner to sign your Be Well paperwork you met LDL less than 130 and Hgba1c less than 7 requirements for insurance discount.  Sincerely,   Gerarda Fraction NP-C

## 2022-05-26 ENCOUNTER — Ambulatory Visit: Payer: Self-pay | Admitting: Occupational Medicine

## 2022-05-26 VITALS — BP 140/88 | Ht 64.0 in | Wt 169.0 lb

## 2022-05-26 DIAGNOSIS — Z0000 Encounter for general adult medical examination without abnormal findings: Secondary | ICD-10-CM

## 2022-05-26 DIAGNOSIS — E559 Vitamin D deficiency, unspecified: Secondary | ICD-10-CM

## 2022-05-26 DIAGNOSIS — R7303 Prediabetes: Secondary | ICD-10-CM

## 2022-05-26 NOTE — Progress Notes (Signed)
Shaye, Your LDL cholesterol was elevated along with 3 month blood sugar (HgbA1c).  Your vitamin D level was low.  Follow up with PCM elevated cholesterol, blood sugar and low Vitamin D. Educated to schedule appt found a MD hasn't scheduled yet.  [employer] handouts on vitamin D deficiency, prediabetes, prediabetes diet, calorie counting for weight loss, cholesterol, high fiber diet and [employer] dietitian information sent to your my chart account.  The dietitian offers free visits. Link to make appt  [facility] ([email or web address]) emailed to patient. I recommend 15 minutes sunlight on skin daily and start cholecalciferol 50,000 units weekly take with fattiest meal for best absorption and repeat level in 6 months not fasting.I sent Rx to your pharmacy [facility] . Repeat all other labs in 1 year except the following sooner: Weight, BP (blood pressure) check and Hgab1c nonfasting in 3 months also.   NP recommend weight loss 5-10 lbs, exercise 150 minutes per week; 20 grams women per up to date; eat whole grains/fruits/vegetables; keep added sugars to less than 100 calories/ 5 teaspoons for women per American Heart Association;  electrolytes, iron, kidney/liver/thyroid function and complete blood count normal.    Be well insurance premium discount evaluation:   Patient is smoker  tobacco cessation alteratives completed [employer] video and quiz.   Epic reviewed by RN Evlyn Kanner and transcribed labs. Tobacco attestation signed. [facility] ROI formed signed. Forms placed in the chart.   Patient given handouts for [facility] pharmacies and discount drugs list,MyChart, Tele doc setup, Tele doc Behavioral, [place] counseling and [facility] counseling.  What to do for infectious illness protocol. Given handout for list of medications that can be filled at [facility]. Given Clinic hours and Clinic Email.   Fu labs scheduled.

## 2022-05-28 ENCOUNTER — Other Ambulatory Visit (HOSPITAL_COMMUNITY): Payer: Self-pay

## 2022-05-28 MED ORDER — DEXTROAMPHETAMINE SULFATE 10 MG PO TABS
10.0000 mg | ORAL_TABLET | ORAL | 0 refills | Status: DC
  Filled 2022-05-28: qty 180, 30d supply, fill #0

## 2022-06-16 IMAGING — US US OB < 14 WEEKS - US OB TV
1 series · 15 of 28 positions shown · non-contrast
Comparison: None.

CLINICAL DATA: Initial evaluation for light vaginal bleeding, early
pregnancy.

EXAM:
OBSTETRIC <14 WK US AND TRANSVAGINAL OB US
TECHNIQUE: Both transabdominal and transvaginal ultrasound examinations were
performed for complete evaluation of the gestation as well as the
maternal uterus, adnexal regions, and pelvic cul-de-sac.
Transvaginal technique was performed to assess early pregnancy.

[Series 1: us ob < 14 weeks - us ob tv · 15 of 45 slices shown]
[im 1/45]
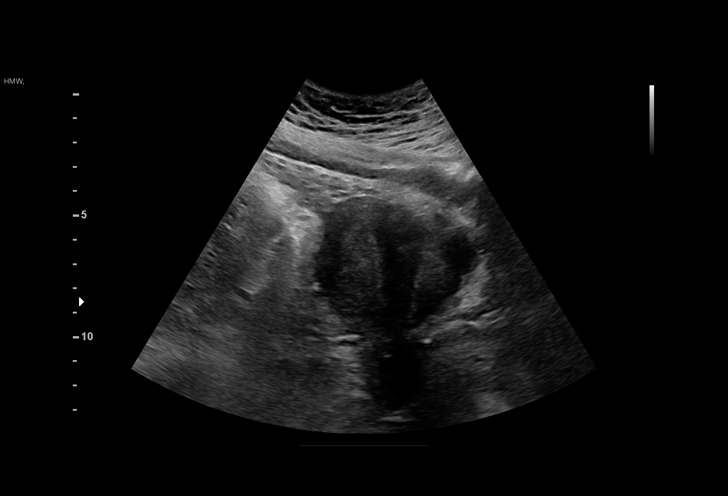
[im 4/45]
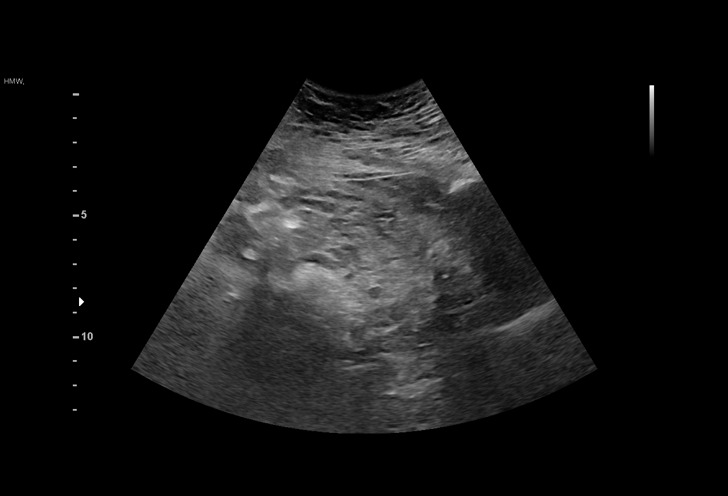
[im 7/45]
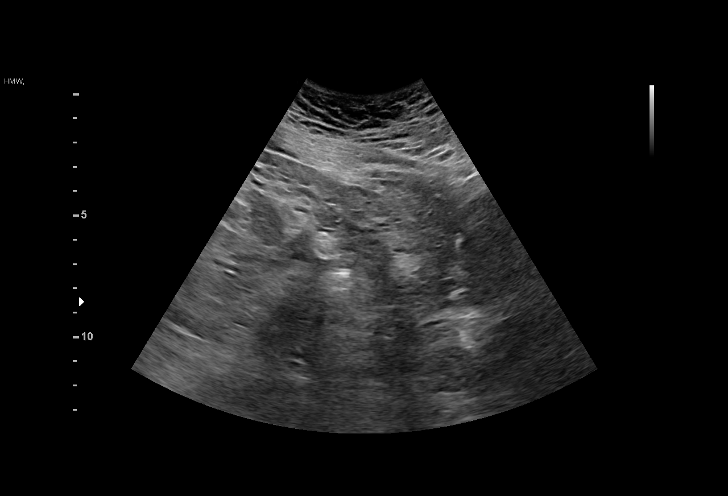
[im 10/45]
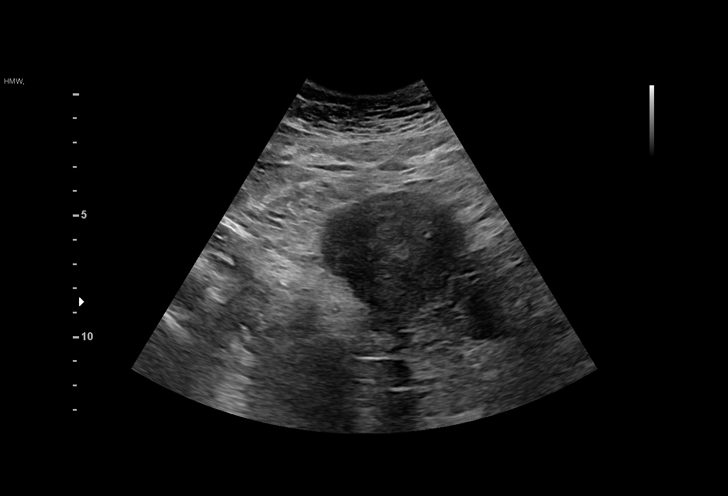
[im 14/45]
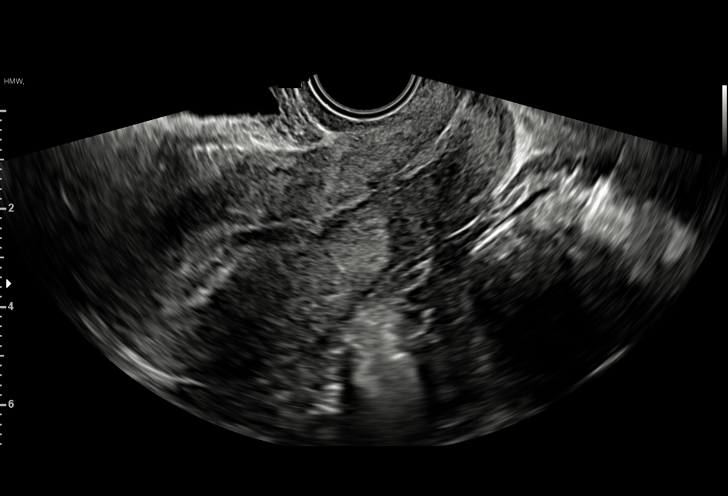
[im 17/45]
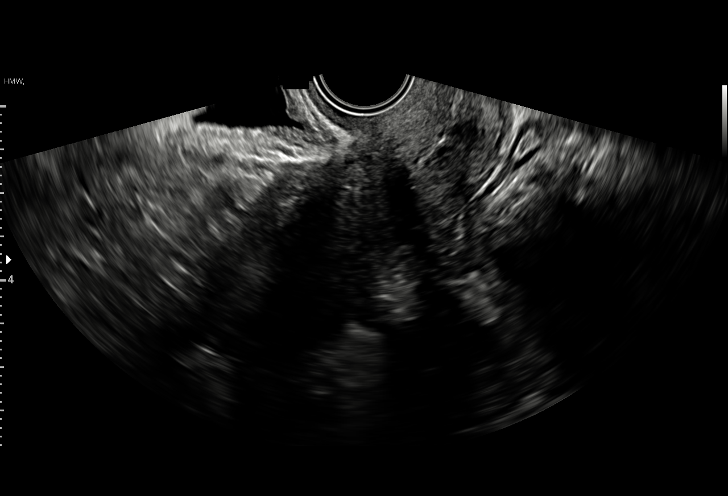
[im 20/45]
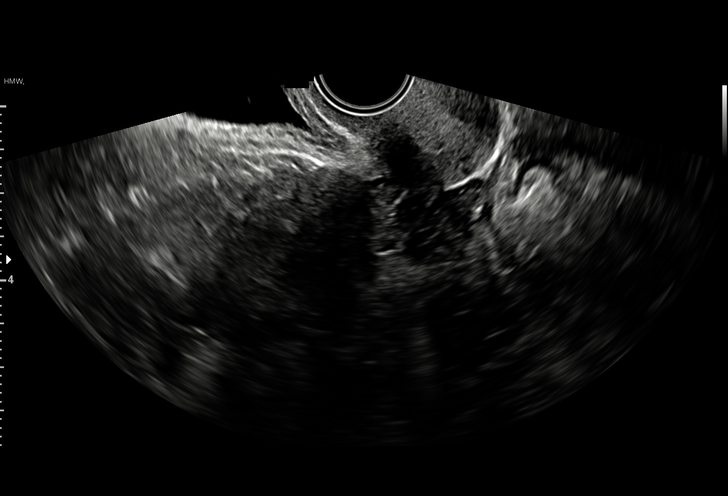
[im 23/45]
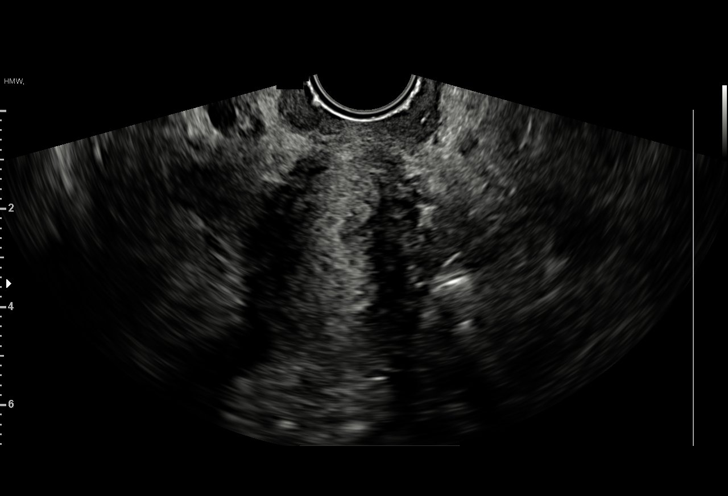
[im 25/45]
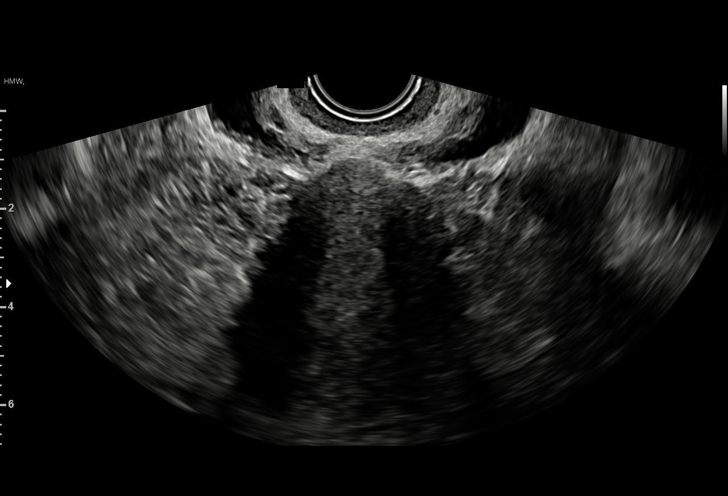
[im 28/45]
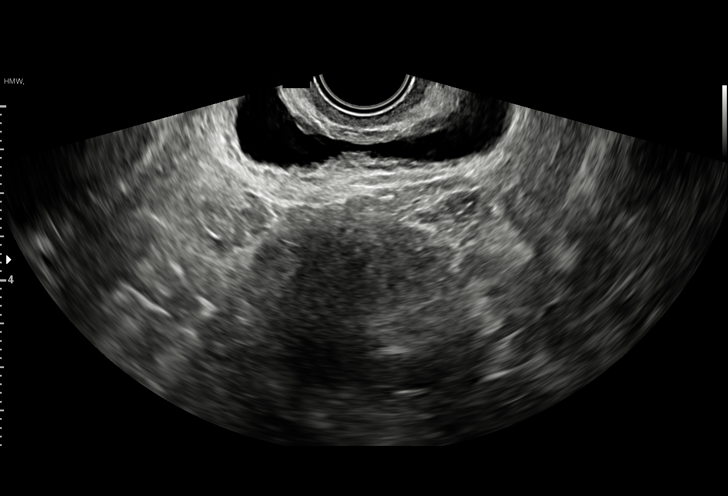
[im 31/45]
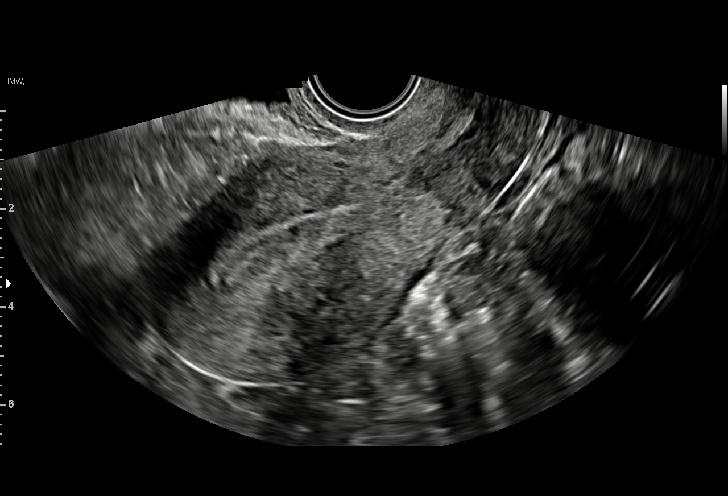
[im 35/45]
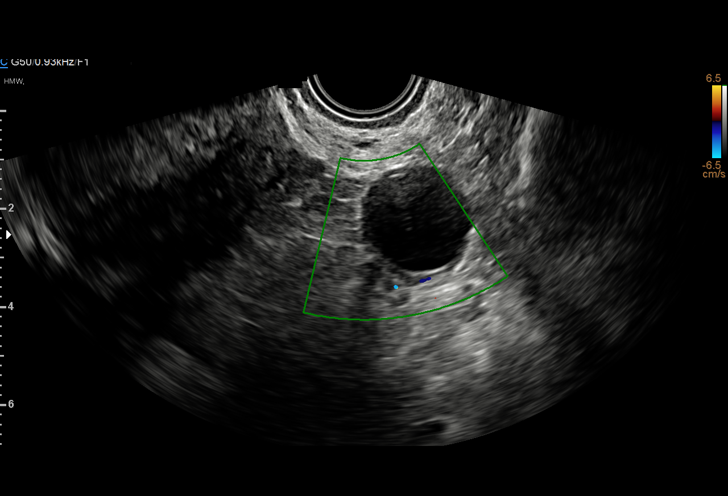
[im 38/45]
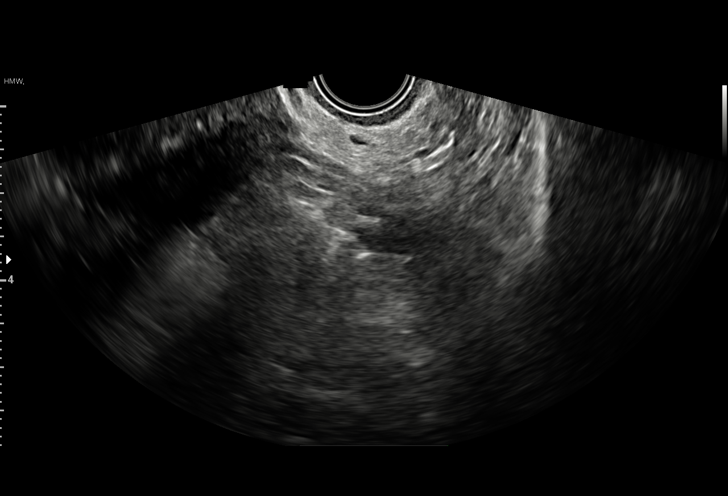
[im 41/45]
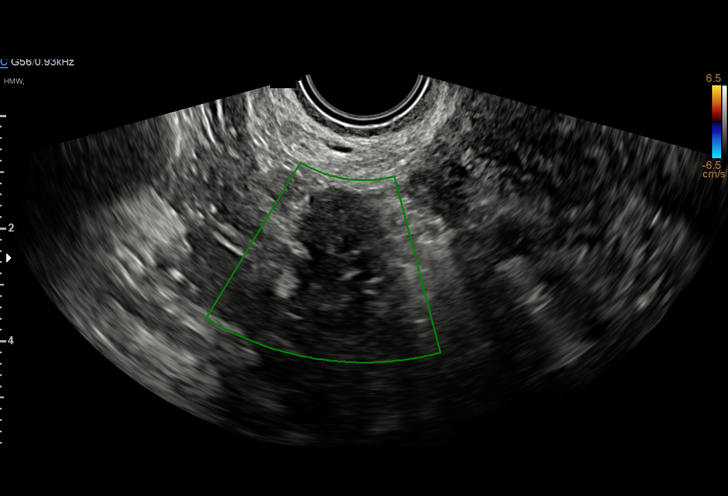
[im 45/45]
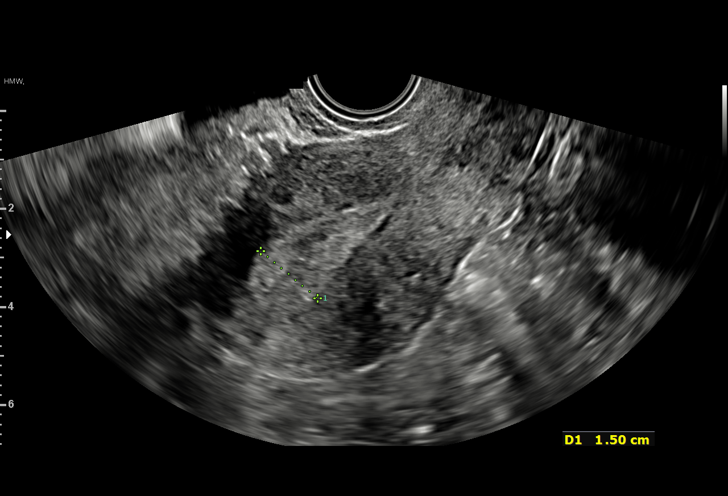

[15 of 28 positions shown; findings below may reference images not displayed]

FINDINGS: Intrauterine gestational sac: Negative. Endometrial stripe measures
14.5 mm in thickness.

Yolk sac:  Negative.

Embryo:  Negative.

Cardiac Activity: Negative.

Heart Rate: N/A

Subchorionic hemorrhage:  None visualized.

Maternal uterus/adnexae: Right ovary within normal limits. Small
corpus luteal cyst noted within the left ovary. No adnexal mass or
free fluid.
IMPRESSION: 1. Early pregnancy with no discrete IUP or adnexal mass identified.
Finding is consistent with a pregnancy of unknown anatomic location.
Differential considerations include IUP to early to visualize,
recent SAB, or possibly occult ectopic pregnancy. Close clinical
monitoring with serial beta HCGs and close interval follow-up
ultrasound recommended as clinically warranted.
2. No other acute maternal uterine or adnexal abnormality.

## 2022-06-25 ENCOUNTER — Other Ambulatory Visit (HOSPITAL_COMMUNITY): Payer: Self-pay

## 2022-06-25 MED ORDER — DEXTROAMPHETAMINE SULFATE 10 MG PO TABS
20.0000 mg | ORAL_TABLET | Freq: Three times a day (TID) | ORAL | 0 refills | Status: DC
  Filled 2022-06-28: qty 180, 30d supply, fill #0

## 2022-06-28 ENCOUNTER — Other Ambulatory Visit (HOSPITAL_COMMUNITY): Payer: Self-pay

## 2022-07-26 ENCOUNTER — Other Ambulatory Visit (HOSPITAL_COMMUNITY): Payer: Self-pay

## 2022-07-26 MED ORDER — DEXTROAMPHETAMINE SULFATE 10 MG PO TABS
20.0000 mg | ORAL_TABLET | Freq: Three times a day (TID) | ORAL | 0 refills | Status: DC
  Filled 2022-07-27: qty 180, 30d supply, fill #0

## 2022-07-27 ENCOUNTER — Other Ambulatory Visit (HOSPITAL_COMMUNITY): Payer: Self-pay

## 2022-07-27 ENCOUNTER — Other Ambulatory Visit: Payer: Self-pay

## 2022-08-25 ENCOUNTER — Other Ambulatory Visit (HOSPITAL_COMMUNITY): Payer: Self-pay

## 2022-08-25 ENCOUNTER — Other Ambulatory Visit: Payer: Self-pay | Admitting: Occupational Medicine

## 2022-08-25 MED ORDER — DEXTROAMPHETAMINE SULFATE 10 MG PO TABS
10.0000 mg | ORAL_TABLET | Freq: Three times a day (TID) | ORAL | 0 refills | Status: DC
  Filled 2022-08-27: qty 180, 30d supply, fill #0

## 2022-08-26 ENCOUNTER — Other Ambulatory Visit: Payer: Self-pay | Admitting: Occupational Medicine

## 2022-08-26 ENCOUNTER — Other Ambulatory Visit (HOSPITAL_COMMUNITY): Payer: Self-pay

## 2022-08-26 DIAGNOSIS — R7303 Prediabetes: Secondary | ICD-10-CM

## 2022-08-26 NOTE — Progress Notes (Signed)
Lab drawn from Left AC tolerated well no issues noted.   

## 2022-08-27 ENCOUNTER — Telehealth: Payer: Self-pay | Admitting: Registered Nurse

## 2022-08-27 ENCOUNTER — Other Ambulatory Visit (HOSPITAL_COMMUNITY): Payer: Self-pay

## 2022-08-27 DIAGNOSIS — R7303 Prediabetes: Secondary | ICD-10-CM

## 2022-08-27 DIAGNOSIS — E559 Vitamin D deficiency, unspecified: Secondary | ICD-10-CM

## 2022-08-27 LAB — HEMOGLOBIN A1C
Est. average glucose Bld gHb Est-mCnc: 123 mg/dL
Hgb A1c MFr Bld: 5.9 % — ABNORMAL HIGH (ref 4.8–5.6)

## 2022-08-27 MED ORDER — CHOLECALCIFEROL 1.25 MG (50000 UT) PO CAPS
ORAL_CAPSULE | ORAL | 0 refills | Status: AC
  Filled 2022-08-27: qty 4, 28d supply, fill #0
  Filled 2022-10-12 – 2022-10-26 (×2): qty 4, 28d supply, fill #1

## 2022-08-27 NOTE — Telephone Encounter (Signed)
-----   Message from Dagoberto Ligas, RN sent at 08/26/2022 12:05 PM EDT ----- Regarding: Vit D Please send RX to updated pharmacy [facility] [address]

## 2022-09-02 ENCOUNTER — Other Ambulatory Visit (HOSPITAL_COMMUNITY): Payer: Self-pay

## 2022-09-02 ENCOUNTER — Other Ambulatory Visit (HOSPITAL_BASED_OUTPATIENT_CLINIC_OR_DEPARTMENT_OTHER): Payer: Self-pay

## 2022-09-02 ENCOUNTER — Encounter: Payer: Self-pay | Admitting: Family

## 2022-09-02 ENCOUNTER — Ambulatory Visit: Payer: No Typology Code available for payment source | Admitting: Family

## 2022-09-02 VITALS — BP 130/80 | HR 91 | Temp 98.1°F | Ht 64.5 in | Wt 170.0 lb

## 2022-09-02 DIAGNOSIS — E559 Vitamin D deficiency, unspecified: Secondary | ICD-10-CM | POA: Diagnosis not present

## 2022-09-02 DIAGNOSIS — M546 Pain in thoracic spine: Secondary | ICD-10-CM

## 2022-09-02 DIAGNOSIS — D500 Iron deficiency anemia secondary to blood loss (chronic): Secondary | ICD-10-CM | POA: Diagnosis not present

## 2022-09-02 DIAGNOSIS — E049 Nontoxic goiter, unspecified: Secondary | ICD-10-CM | POA: Insufficient documentation

## 2022-09-02 DIAGNOSIS — R7303 Prediabetes: Secondary | ICD-10-CM

## 2022-09-02 DIAGNOSIS — N62 Hypertrophy of breast: Secondary | ICD-10-CM | POA: Insufficient documentation

## 2022-09-02 DIAGNOSIS — L409 Psoriasis, unspecified: Secondary | ICD-10-CM | POA: Diagnosis not present

## 2022-09-02 DIAGNOSIS — Z1159 Encounter for screening for other viral diseases: Secondary | ICD-10-CM

## 2022-09-02 DIAGNOSIS — F901 Attention-deficit hyperactivity disorder, predominantly hyperactive type: Secondary | ICD-10-CM

## 2022-09-02 DIAGNOSIS — Z720 Tobacco use: Secondary | ICD-10-CM

## 2022-09-02 DIAGNOSIS — G8929 Other chronic pain: Secondary | ICD-10-CM | POA: Insufficient documentation

## 2022-09-02 MED ORDER — CLOBETASOL PROPIONATE 0.05 % EX CREA
1.0000 | TOPICAL_CREAM | Freq: Two times a day (BID) | CUTANEOUS | 0 refills | Status: DC
Start: 2022-09-02 — End: 2023-02-25
  Filled 2022-09-02: qty 30, 30d supply, fill #0

## 2022-09-02 NOTE — Assessment & Plan Note (Signed)
Pt advised of the following: Work on a diabetic diet, try to incorporate exercise at least 20-30 a day for 3 days a week or more.   

## 2022-09-02 NOTE — Patient Instructions (Signed)
   Your imaging for a thyroid ultrasound Has been ordered at the following location.  Please call to schedule a time and date that would work for you.    [address] [place] Phone [phone],  8-430 pm    ------------------------------------ A referral was placed today for plastic surgery  Please let us know if you have not heard back within 2 weeks about the referral.  ------------------------------------  Welcome to our clinic, I am happy to have you as my new patient. I am excited to continue on this healthcare journey with you.  ------------------------------------  Stop by the lab prior to leaving today. I will notify you of your results once received.   Please keep in mind Any my chart messages you send have up to a three business day turnaround for a response.  Phone calls may take up to a one full business day turnaround for a  response.   If you need a medication refill I recommend you request it through the pharmacy as this is easiest for [place] rather than sending a message and or phone call.   Due to recent changes in healthcare laws, you may see results of your imaging and/or laboratory studies on MyChart before I have had a chance to review them.  I understand that in some cases there may be results that are confusing or concerning to you. Please understand that not all results are received at the same time and often I may need to interpret multiple results in order to provide you with the best plan of care or course of treatment. Therefore, I ask that you please give me 2 business days to thoroughly review all your results before contacting my office for clarification. Should we see a critical lab result, you will be contacted sooner.   It was a pleasure seeing you today! Please do not hesitate to reach out with any questions and or concerns.  Regards,   Mort Sawyers FNP-C

## 2022-09-02 NOTE — Progress Notes (Signed)
New Patient Office Visit  Subjective:  Patient ID: Kimberly Rowland, female    DOB: 05-13-1980  Age: 42 y.o. MRN: [id number]  CC:  Chief Complaint  Patient presents with   Establish Care    HPI Kimberly Rowland is here to establish care as a new patient.  Oriented to practice routines and expectations.  Prior provider was: [name], NP   Pt is with acute concerns.  Pt with large breasts, currently 40 DDD and states that she has to wear two bras daily to support, often with upper back pain dull discomfort. No injury in her back.   Pap: does not remember when was the last time.      09/02/2022    8:10 AM  PHQ9 SCORE ONLY  PHQ-9 Total Score 3      09/02/2022    8:04 AM  GAD 7 : Generalized Anxiety Score  Nervous, Anxious, on Edge 0  Control/stop worrying 0  Worry too much - different things 0  Trouble relaxing 0  Restless 0  Easily annoyed or irritable 0  Afraid - awful might happen 0  Total GAD 7 Score 0     chronic concerns:  ADD: sees Dr. Westley Chandler regularly, on adderall 20 mg three times daily . Doing well, helping with focus.   Tobacco use: cigarettes, since high school. 1/2 pack a day.   Hyperlipidemia: mild elevation.  Lab Results  Component Value Date   CHOL 186 05/20/2022   HDL 57 05/20/2022   LDLCALC 110 (H) 05/20/2022   TRIG 108 05/20/2022   CHOLHDL 3.3 05/20/2022    Lab on 08/26/2022  Component Date Value Ref Range Status   Hgb A1c MFr Bld 08/26/2022 5.9 (H)  4.8 - 5.6 % Final   Comment:          Prediabetes: 5.7 - 6.4          Diabetes: >6.4          Glycemic control for adults with diabetes: <7.0    Est. average glucose Bld gHb Est-m* 08/26/2022 123  mg/dL Final   Wt Readings from Last 3 Encounters:  09/02/22 170 lb (77.1 kg)  05/26/22 169 lb (76.7 kg)  01/18/21 168 lb 6.4 oz (76.4 kg)   Temp Readings from Last 3 Encounters:  09/02/22 98.1 F (36.7 C) (Oral)  10/06/21 97.8 F (36.6 C)  08/11/21 99.4 F (37.4 C) (Tympanic)    BP Readings from Last 3 Encounters:  09/02/22 130/80  08/26/22 117/76  05/26/22 (!) 140/88   Pulse Readings from Last 3 Encounters:  09/02/22 91  08/26/22 98  10/06/21 88       ROS: Negative unless specifically indicated above in HPI.   Current Outpatient Medications:    Cholecalciferol 1.25 MG (50000 UT) capsule, Take by mouth once a week for 12 doses., Disp: 12 capsule, Rfl: 0   clobetasol cream (TEMOVATE) 0.05 %, Apply 1 Application topically 2 (two) times daily., Disp: 30 g, Rfl: 0   dextroamphetamine (DEXTROSTAT) 10 MG tablet, Take 2 tablets (20 mg total) by mouth every morning AND 2 tablets (20 mg total) daily at 12 noon AND 2 tablets (20 mg total) daily at 4 PM., Disp: 180 tablet, Rfl: 0   ibuprofen (ADVIL) 800 MG tablet, Take 1 tablet by mouth every 8 (eight) hours as needed., Disp: , Rfl:    mineral oil-hydrophilic petrolatum (AQUAPHOR) ointment, Apply topically as needed for dry skin., Disp: 420 g, Rfl: 0 Past Medical History:  Diagnosis  Date   ADD (attention deficit disorder)    Past Surgical History:  Procedure Laterality Date   NO PAST SURGERIES      Objective:   Today's Vitals: BP 130/80   Pulse 91   Temp 98.1 F (36.7 C) (Oral)   Ht 5' 4.5" (1.638 m)   Wt 170 lb (77.1 kg)   SpO2 96%   BMI 28.73 kg/m   Physical Exam Vitals reviewed.  Constitutional:      General: She is not in acute distress.    Appearance: Normal appearance. She is normal weight. She is not ill-appearing.  HENT:     Head: Normocephalic.     Right Ear: Tympanic membrane normal.     Left Ear: Tympanic membrane normal.     Nose: Nose normal.     Mouth/Throat:     Mouth: Mucous membranes are moist.  Eyes:     Extraocular Movements: Extraocular movements intact.     Pupils: Pupils are equal, round, and reactive to light.  Neck:     Thyroid: Thyromegaly (left greater than right) present.  Cardiovascular:     Rate and Rhythm: Normal rate and regular rhythm.  Pulmonary:      Effort: Pulmonary effort is normal.     Breath sounds: Normal breath sounds.  Abdominal:     General: Abdomen is flat. Bowel sounds are normal.     Palpations: Abdomen is soft.     Tenderness: There is no guarding or rebound.  Musculoskeletal:        General: Normal range of motion.     Cervical back: Normal range of motion.     Thoracic back: Spasms and tenderness present. Normal range of motion.  Skin:    General: Skin is warm.     Capillary Refill: Capillary refill takes less than 2 seconds.  Neurological:     General: No focal deficit present.     Mental Status: She is alert.  Psychiatric:        Mood and Affect: Mood normal.        Behavior: Behavior normal.        Thought Content: Thought content normal.        Judgment: Judgment normal.     Assessment & Plan:  Need for hepatitis C screening test  Psoriasis Assessment & Plan: Rx clobetasol cream prescribed  Trial   Orders: -     Clobetasol Propionate; Apply 1 Application topically 2 (two) times daily.  Dispense: 30 g; Refill: 0  Vitamin D deficiency Assessment & Plan: Complete RX then start daily supplementation 2000 IU vitamin D3    Prediabetes Assessment & Plan: Pt advised of the following: Work on a diabetic diet, try to incorporate exercise at least 20-30 a day for 3 days a week or more.     Iron deficiency anemia due to chronic blood loss Assessment & Plan: stable   Chronic bilateral thoracic back pain -     Ambulatory referral to Plastic Surgery  Large breasts Assessment & Plan: Likely contributing to back pain.  Spasm and back tightness on exam.  Recommend referral to plastic surgery to evaluate further for possible reduction  Orders: -     Ambulatory referral to Plastic Surgery  Goiter Assessment & Plan: Palpated on physical exam Ultrasound thyroid ordered pending results   Orders: -     US THYROID; Future  Tobacco use Assessment & Plan: Smoking cessation instruction/counseling  given:  counseled patient on the dangers of tobacco use,  advised patient to stop smoking, and reviewed strategies to maximize success     Attention deficit hyperactivity disorder (ADHD), predominantly hyperactive type Assessment & Plan: Continue f/u as scheduled with psychiatry      Follow-up: Return in about 1 year (around 09/02/2023) for f/u PAP, f/u CPE.   Mort Sawyers, FNP

## 2022-09-02 NOTE — Assessment & Plan Note (Signed)
Continue f/u as scheduled with psychiatry

## 2022-09-02 NOTE — Assessment & Plan Note (Signed)
Complete RX then start daily supplementation 2000 IU vitamin D3

## 2022-09-02 NOTE — Assessment & Plan Note (Signed)
Likely contributing to back pain.  Spasm and back tightness on exam.  Recommend referral to plastic surgery to evaluate further for possible reduction

## 2022-09-02 NOTE — Assessment & Plan Note (Signed)
Palpated on physical exam Ultrasound thyroid ordered pending results

## 2022-09-02 NOTE — Assessment & Plan Note (Signed)
stable °

## 2022-09-02 NOTE — Assessment & Plan Note (Signed)
Rx clobetasol cream prescribed  Trial

## 2022-09-02 NOTE — Assessment & Plan Note (Signed)
Smoking cessation instruction/counseling given:  counseled patient on the dangers of tobacco use, advised patient to stop smoking, and reviewed strategies to maximize success 

## 2022-09-07 ENCOUNTER — Ambulatory Visit: Payer: No Typology Code available for payment source | Admitting: Occupational Medicine

## 2022-09-07 DIAGNOSIS — Z719 Counseling, unspecified: Secondary | ICD-10-CM

## 2022-09-07 NOTE — Progress Notes (Signed)
Patient wanted to talk about how excited she is with new PCP and referral to have breast reduction surgery. Patient wanted to update demographic not to have any patient contacts because someone was calling her father today. Also wanted her picture updated.

## 2022-09-14 ENCOUNTER — Ambulatory Visit
Admission: RE | Admit: 2022-09-14 | Discharge: 2022-09-14 | Disposition: A | Discharge status: Home / Self Care | Payer: No Typology Code available for payment source | Source: Ambulatory Visit | Attending: Family

## 2022-09-14 DIAGNOSIS — E049 Nontoxic goiter, unspecified: Secondary | ICD-10-CM

## 2022-09-15 ENCOUNTER — Other Ambulatory Visit: Payer: Self-pay | Admitting: Family

## 2022-09-15 ENCOUNTER — Encounter: Payer: Self-pay | Admitting: Family

## 2022-09-15 DIAGNOSIS — E041 Nontoxic single thyroid nodule: Secondary | ICD-10-CM

## 2022-09-24 ENCOUNTER — Other Ambulatory Visit (HOSPITAL_COMMUNITY): Payer: Self-pay

## 2022-09-24 MED ORDER — DEXTROAMPHETAMINE SULFATE 10 MG PO TABS
20.0000 mg | ORAL_TABLET | Freq: Three times a day (TID) | ORAL | 0 refills | Status: DC
  Filled 2022-09-27 (×2): qty 180, 30d supply, fill #0

## 2022-09-27 ENCOUNTER — Other Ambulatory Visit (HOSPITAL_COMMUNITY): Payer: Self-pay

## 2022-09-27 ENCOUNTER — Other Ambulatory Visit: Payer: Self-pay

## 2022-10-01 ENCOUNTER — Other Ambulatory Visit (HOSPITAL_COMMUNITY): Payer: Self-pay

## 2022-10-12 ENCOUNTER — Other Ambulatory Visit (HOSPITAL_COMMUNITY): Payer: Self-pay

## 2022-10-13 ENCOUNTER — Ambulatory Visit: Payer: No Typology Code available for payment source | Admitting: Plastic Surgery

## 2022-10-13 ENCOUNTER — Encounter: Payer: Self-pay | Admitting: Plastic Surgery

## 2022-10-13 VITALS — BP 131/90 | HR 96 | Ht 64.0 in | Wt 166.4 lb

## 2022-10-13 DIAGNOSIS — N62 Hypertrophy of breast: Secondary | ICD-10-CM | POA: Diagnosis not present

## 2022-10-13 DIAGNOSIS — Z6828 Body mass index (BMI) 28.0-28.9, adult: Secondary | ICD-10-CM | POA: Diagnosis not present

## 2022-10-13 DIAGNOSIS — M542 Cervicalgia: Secondary | ICD-10-CM

## 2022-10-13 DIAGNOSIS — M546 Pain in thoracic spine: Secondary | ICD-10-CM

## 2022-10-13 NOTE — Progress Notes (Signed)
Referring Provider Mort Sawyers, FNP [address] [place] [employer] [employer],  [place] [address]   CC:  Chief Complaint  Patient presents with   Consult      Kimberly Rowland is an 42 y.o. female.  HPI: Kimberly Rowland is a 42 year old female who presents today with complaints of upper back and neck pain.  She states that the upper back and neck pain has gotten worse since 2016 when she became pregnant with her son though she has had large breasts all her life.  She is interested in a bilateral breast reduction.  She does not have any history of prior breast disease.  Allergies  Allergen Reactions   Augmentin [Amoxicillin-Pot Clavulanate] Itching    Hives     Outpatient Encounter Medications as of 10/13/2022  Medication Sig   Cholecalciferol 1.25 MG (50000 UT) capsule Take by mouth once a week for 12 doses.   dextroamphetamine (DEXTROSTAT) 10 MG tablet TAKE 2 TABLETS IN THE MORNING, 2 TABLETS AT NOON AND 2 TABLETS AT 4PM.   clobetasol cream (TEMOVATE) 0.05 % Apply 1 Application topically 2 (two) times daily.   dextroamphetamine (DEXTROSTAT) 10 MG tablet Take 2 tablets (20 mg total) by mouth every morning AND 2 tablets (20 mg total) daily at 12 noon AND 2 tablets (20 mg total) daily at 4 PM.   ibuprofen (ADVIL) 800 MG tablet Take 1 tablet by mouth every 8 (eight) hours as needed.   mineral oil-hydrophilic petrolatum (AQUAPHOR) ointment Apply topically as needed for dry skin.   No facility-administered encounter medications on file as of 10/13/2022.     Past Medical History:  Diagnosis Date   ADD (attention deficit disorder)     Past Surgical History:  Procedure Laterality Date   NO PAST SURGERIES      Family History  Problem Relation Age of Onset   Healthy Mother    Healthy Father    Breast cancer Maternal Grandmother     Social History   Social History Narrative   42 y/o boy, Kimberly Rowland ([birth year]24)      Review of Systems General: Denies fevers, chills, weight loss CV: Denies chest  pain, shortness of breath, palpitations Breast: No specific complaints regarding her breasts.  She does note that she has had upper back and neck pain related to the large size of her breast.  She also finds it difficult to find bras that fit properly.  Physical Exam    10/13/2022    8:33 AM 09/02/2022    8:05 AM 08/26/2022   12:01 PM  Vitals with BMI  Height 5\' 4"  5' 4.5"   Weight 166 lbs 6 oz 170 lbs   BMI 28.55 28.74   Systolic 131 130 191  Diastolic 90 80 76  Pulse 96 91 98    General:  No acute distress,  Alert and oriented, Non-Toxic, Normal speech and affect Breast: Patient has large breasts that are pendulous with grade 3 ptosis.  There are no dominant masses on examination.  The nipples are normal in appearance.  Her sternal notch to nipple distance is 32 cm on the right and 32 cm on the left her nipple to fold distance on the right is 16 cm and 15 cm on the left Mammogram: Patient states she had a mammogram in October or November of last year which was read as normal.  We will obtain a copy of this mammogram.  She will need another mammogram prior to her procedure Assessment/Plan Macromastia: Patient has  large breasts and I believe I can remove 500 g per breast.  We discussed breast reductions at length including the location of the incisions and the unpredictable nature of scarring.  We discussed the risks of bleeding, infection, and seroma formation.  She understands I will use drains postoperatively.  These will be removed the day after surgery.  We did discuss the risk of nipple loss due to nipple ischemia.  We also discussed the possibility that a breast reduction could make interpretation of mammograms more difficult in the future.  We discussed the postoperative limitations including no heavy lifting greater than 20 pounds, no vigorous activity, and no submerging the incisions in water for 6 weeks.  She understands that she will need to wear a lightly compressive and supportive  garment for 6 weeks postoperatively.  She may return to light activity as soon as she feels comfortable.  All questions were answered to her satisfaction.  Photographs were obtained today with her consent.  Will submit her for a bilateral breast reduction at her request.  Santiago Glad 10/13/2022, 8:58 AM

## 2022-10-17 ENCOUNTER — Other Ambulatory Visit: Payer: Self-pay | Admitting: Registered Nurse

## 2022-10-17 DIAGNOSIS — Z720 Tobacco use: Secondary | ICD-10-CM

## 2022-10-17 DIAGNOSIS — E559 Vitamin D deficiency, unspecified: Secondary | ICD-10-CM

## 2022-10-17 DIAGNOSIS — R7303 Prediabetes: Secondary | ICD-10-CM

## 2022-10-17 NOTE — Progress Notes (Signed)
Patient met with RN Bess Kinds 05/26/22 reviewed lab results/recommendations.  Patient saw [facility] 09/02/22.  BP 140/88 LDL 110 Hgba1c 5.8 weight [id number]; smoker completed brain shark quiz and video met requirements for employer insurance discount FY 2025 NP signed provider paperwork 06/01/22 and RN Kimrey notified HR dept patient met requirements  My chart message sent to patient Kimberly Rowland, Due to new national guidelines your vitamin D recheck for 11/24/22 cancelled.  Continue your 50,000 units once a week until your rx finished then start 2000 units by mouth over the counter daily with meal.  Next Hgba1c recheck 27 Apr 2023  Please let us know if you have further questions.  Sincerely,  [name] NP-C

## 2022-10-22 ENCOUNTER — Other Ambulatory Visit (HOSPITAL_COMMUNITY): Payer: Self-pay

## 2022-10-25 ENCOUNTER — Other Ambulatory Visit (HOSPITAL_COMMUNITY): Payer: Self-pay

## 2022-10-25 MED ORDER — DEXTROAMPHETAMINE SULFATE 10 MG PO TABS
20.0000 mg | ORAL_TABLET | Freq: Three times a day (TID) | ORAL | 0 refills | Status: DC
Start: 2022-10-27 — End: 2022-11-26
  Filled 2022-10-27: qty 180, 30d supply, fill #0
  Filled ????-??-??: fill #0

## 2022-10-26 ENCOUNTER — Other Ambulatory Visit (HOSPITAL_COMMUNITY): Payer: Self-pay

## 2022-10-27 ENCOUNTER — Other Ambulatory Visit (HOSPITAL_COMMUNITY): Payer: Self-pay

## 2022-10-27 ENCOUNTER — Other Ambulatory Visit: Payer: Self-pay

## 2022-11-24 ENCOUNTER — Other Ambulatory Visit: Payer: Self-pay

## 2022-11-26 ENCOUNTER — Other Ambulatory Visit (HOSPITAL_COMMUNITY): Payer: Self-pay

## 2022-11-26 MED ORDER — DEXTROAMPHETAMINE SULFATE 10 MG PO TABS
20.0000 mg | ORAL_TABLET | Freq: Three times a day (TID) | ORAL | 0 refills | Status: AC
Start: 2022-11-26 — End: ?
  Filled 2022-11-26 (×2): qty 180, 30d supply, fill #0

## 2022-12-20 ENCOUNTER — Other Ambulatory Visit (HOSPITAL_COMMUNITY): Payer: Self-pay

## 2022-12-20 ENCOUNTER — Other Ambulatory Visit: Payer: Self-pay

## 2022-12-20 ENCOUNTER — Telehealth: Payer: Self-pay | Admitting: Registered Nurse

## 2022-12-20 DIAGNOSIS — R7303 Prediabetes: Secondary | ICD-10-CM

## 2022-12-20 DIAGNOSIS — E559 Vitamin D deficiency, unspecified: Secondary | ICD-10-CM

## 2022-12-20 MED ORDER — VITAMIN D3 50 MCG (2000 UT) PO TABS
2000.0000 [IU] | ORAL_TABLET | Freq: Every day | ORAL | 3 refills | Status: AC
Start: 2022-12-20 — End: ?
  Filled 2022-12-20: qty 100, 100d supply, fill #0

## 2022-12-23 ENCOUNTER — Other Ambulatory Visit: Payer: Self-pay

## 2022-12-23 ENCOUNTER — Other Ambulatory Visit (HOSPITAL_COMMUNITY): Payer: Self-pay

## 2022-12-23 MED ORDER — DEXTROAMPHETAMINE SULFATE 10 MG PO TABS
20.0000 mg | ORAL_TABLET | Freq: Three times a day (TID) | ORAL | 0 refills | Status: DC
Start: 2022-12-25 — End: 2023-10-24
  Filled 2022-12-27: qty 180, 30d supply, fill #0

## 2022-12-27 ENCOUNTER — Other Ambulatory Visit (HOSPITAL_COMMUNITY): Payer: Self-pay

## 2022-12-28 NOTE — Telephone Encounter (Signed)
Discussed with patient and she stated she will come to clinic in January for next labs/BP/weight follow up.  Denied further questions or concerns and reviewed my chart note per patient.

## 2023-01-06 ENCOUNTER — Ambulatory Visit: Payer: No Typology Code available for payment source

## 2023-01-10 ENCOUNTER — Ambulatory Visit: Payer: Self-pay

## 2023-01-10 DIAGNOSIS — Z23 Encounter for immunization: Secondary | ICD-10-CM

## 2023-01-19 ENCOUNTER — Ambulatory Visit (INDEPENDENT_AMBULATORY_CARE_PROVIDER_SITE_OTHER): Payer: No Typology Code available for payment source | Admitting: Physician Assistant

## 2023-01-19 ENCOUNTER — Encounter: Payer: Self-pay | Admitting: Physician Assistant

## 2023-01-19 VITALS — BP 127/87 | HR 94 | Ht 64.0 in | Wt 165.8 lb

## 2023-01-19 DIAGNOSIS — N62 Hypertrophy of breast: Secondary | ICD-10-CM

## 2023-01-19 NOTE — Progress Notes (Cosign Needed Addendum)
Patient ID: Kimberly Rowland, female    DOB: 06-10-1980, 42 y.o.   MRN: [id number]  Chief Complaint  Patient presents with   Pre-op Exam      ICD-10-CM   1. Macromastia  N62        History of Present Illness: Kimberly Rowland is a 42 y.o.  female  with a history of macromastia.  She presents for preoperative evaluation for upcoming procedure, bilateral breast reduction, scheduled for 02/01/2023 with Dr.  Ladona Ridgel .  The patient has never before had surgery or required anesthesia.  She is accompanied by her mother at bedside who reports that she will be assisting with patient's postoperative recovery.  Patient has an 63-year-old son.  Unfortunately she does admit daily smoking.  Discussed with patient that we will need to postpone surgery.  Will have her follow-up via telephone in 4 weeks for no charge encounter to see if she has stopped smoking.  At that time, we will order nicotine/cotinine test and schedule surgery pending negative screening.  She denies any personal or family history of blood clots or clotting disorder.  No personal history of cancer, use of anticoagulation, cardiac or pulmonary disease, or other notable history.  She will hold her ADD medication on the day of surgery.  PCN allergy.  She also believes that she is due for mammogram next month rather than in January.  Emphasized the importance of getting it done prior to surgery. Completed consent form here today in person.  Summary of Previous Visit: She was seen for initial consult by Dr. Ladona Ridgel on 10/13/2022.  At that time, complained of chronic upper back and neck discomfort in the context of large breasts.  STN 32 cm each side.  Estimated excess breast tissue removed at time of surgery was 500 g each side.  Discussed plans for surgery and patient was agreeable to proceed.  Job: Inventory control for replacements limited, discussed 3 weeks FMLA.  PMH Significant for: Macromastia, ADD, tobacco use disorder.   Past  Medical History: Allergies: Allergies  Allergen Reactions   Augmentin [Amoxicillin-Pot Clavulanate] Itching    Hives     Current Medications:  Current Outpatient Medications:    Cholecalciferol (VITAMIN D3) 50 MCG (2000 UT) TABS, Take 1 tablet (2,000 Units total) by mouth daily with a meal, Disp: 100 tablet, Rfl: 3   dextroamphetamine (DEXTROSTAT) 10 MG tablet, Take 2 tablets (20 mg total) by mouth 3 (three) times daily., Disp: 180 tablet, Rfl: 0   clobetasol cream (TEMOVATE) 0.05 %, Apply 1 Application topically 2 (two) times daily., Disp: 30 g, Rfl: 0   dextroamphetamine (DEXTROSTAT) 10 MG tablet, Take 2 tablets (20 mg total) by mouth every morning AND 2 tablets (20 mg total) daily at 12 noon AND 2 tablets (20 mg total) daily at 4 PM., Disp: 180 tablet, Rfl: 0   dextroamphetamine (DEXTROSTAT) 10 MG tablet, TAKE 2 TABLETS IN THE MORNING, 2 TABLETS AT NOON AND 2 TABLETS AT 4PM., Disp: 180 tablet, Rfl: 0   ibuprofen (ADVIL) 800 MG tablet, Take 1 tablet by mouth every 8 (eight) hours as needed., Disp: , Rfl:    mineral oil-hydrophilic petrolatum (AQUAPHOR) ointment, Apply topically as needed for dry skin., Disp: 420 g, Rfl: 0  Past Medical Problems: Past Medical History:  Diagnosis Date   ADD (attention deficit disorder)     Past Surgical History: Past Surgical History:  Procedure Laterality Date   NO PAST SURGERIES      Social History: Social  History   Socioeconomic History   Marital status: Single    Spouse name: Not on file   Number of children: 1   Years of education: Not on file   Highest education level: Not on file  Occupational History    Employer: [employer]    Comment: inventory control  Tobacco Use   Smoking status: Every Day    Current packs/day: 0.50    Types: Cigarettes   Smokeless tobacco: Not on file  Vaping Use   Vaping status: Never Used  Substance and Sexual Activity   Alcohol use: No   Drug use: No   Sexual activity: Not Currently     Partners: Male    Birth control/protection: None  Other Topics Concern   Not on file  Social History Narrative   42 y/o boy, Kimberly Rowland ([birth year]24)    Social Determinants of Health   Financial Resource Strain: Not on file  Food Insecurity: Not on file  Transportation Needs: Not on file  Physical Activity: Not on file  Stress: Not on file  Social Connections: Unknown (08/04/2021)   [facility], [facility]   Social Network    Social Network: Not on file  Intimate Partner Violence: Unknown (06/26/2021)   Received from [facility], [facility]   HITS    Physically Hurt: Not on file    Insult or Talk Down To: Not on file    Threaten Physical Harm: Not on file    Scream or Curse: Not on file    Family History: Family History  Problem Relation Age of Onset   Healthy Mother    Healthy Father    Breast cancer Maternal Grandmother     Review of Systems: ROS Denies any recent illness or trauma.  Physical Exam: Vital Signs BP 127/87 (BP Location: Left Arm, Patient Position: Sitting, Cuff Size: Normal)   Pulse 94   Ht 5\' 4"  (1.626 m)   Wt 165 lb 12.8 oz (75.2 kg)   SpO2 96%   BMI 28.46 kg/m   Physical Exam  Constitutional:      General: Not in acute distress.    Appearance: Normal appearance. Not ill-appearing.  HENT:     Head: Normocephalic and atraumatic.  Eyes:     Pupils: Pupils are equal, round. Cardiovascular:     Rate and Rhythm: Normal rate.    Pulses: Normal pulses.  Pulmonary:     Effort: No respiratory distress or increased work of breathing.  Speaks in full sentences. Abdominal:     General: Abdomen is flat. No distension.   Musculoskeletal: Normal range of motion. No lower extremity swelling or edema. No varicosities. Skin:    General: Skin is warm and dry.     Findings: No erythema or rash.  Neurological:     Mental Status: Alert and oriented to person, place, and time.  Psychiatric:        Mood and Affect: Mood normal.         Behavior: Behavior normal.    Assessment/Plan: The patient is scheduled for bilateral breast reduction with Dr.  Ladona Ridgel .  Risks, benefits, and alternatives of procedure discussed, questions answered and consent obtained.    Smoking Status: Current everyday smoker; Counseling Given?  Postponing her elective breast reduction surgery until negative screening.  Patient is confident that she can quit. Last Mammogram: 04/07/2022; Results: BI-RADS Category 1: Negative.  Caprini Score: 4; Risk Factors include: Age, BMI greater than 25, and length of planned surgery.  Recommendation for mechanical prophylaxis. Encourage early ambulation.   Pictures obtained: 10/13/2022  Post-op Rx sent to pharmacy: Will defer until next preoperative encounter given postponed surgery.  Patient was provided with the General Surgical Risk consent document and Pain Medication Agreement prior to their appointment.  They had adequate time to read through the risk consent documents and Pain Medication Agreement. We also discussed them in person together during this preop appointment. All of their questions were answered to their satisfaction.  Recommended calling if they have any further questions.  Risk consent form and Pain Medication Agreement to be scanned into patient's chart.  The risk that can be encountered with breast reduction were discussed and include the following but not limited to these:  Breast asymmetry, fluid accumulation, firmness of the breast, inability to breast feed, loss of nipple or areola, skin loss, decrease or no nipple sensation, fat necrosis of the breast tissue, bleeding, infection, healing delay.  There are risks of anesthesia, changes to skin sensation and injury to nerves or blood vessels.  The muscle can be temporarily or permanently injured.  You may have an allergic reaction to tape, suture, glue, blood products which can result in skin discoloration, swelling, pain, skin lesions, poor healing.   Any of these can lead to the need for revisonal surgery or stage procedures.  A reduction has potential to interfere with diagnostic procedures.  Nipple or breast piercing can increase risks of infection.  This procedure is best done when the breast is fully developed.  Changes in the breast will continue to occur over time.  Pregnancy can alter the outcomes of previous breast reduction surgery, weight gain and weigh loss can also effect the long term appearance.     Electronically signed by: Evelena Leyden, PA-C 01/19/2023 10:17 AM

## 2023-01-24 ENCOUNTER — Other Ambulatory Visit (HOSPITAL_COMMUNITY): Payer: Self-pay

## 2023-01-24 MED ORDER — DEXTROAMPHETAMINE SULFATE 10 MG PO TABS
20.0000 mg | ORAL_TABLET | Freq: Three times a day (TID) | ORAL | 0 refills | Status: DC
Start: 2023-01-26 — End: 2023-10-24
  Filled 2023-01-26 (×2): qty 180, 30d supply, fill #0

## 2023-01-25 ENCOUNTER — Other Ambulatory Visit (HOSPITAL_COMMUNITY): Payer: Self-pay

## 2023-01-26 ENCOUNTER — Other Ambulatory Visit (HOSPITAL_COMMUNITY): Payer: Self-pay

## 2023-01-26 ENCOUNTER — Other Ambulatory Visit: Payer: Self-pay

## 2023-02-03 ENCOUNTER — Encounter: Payer: No Typology Code available for payment source | Admitting: Student

## 2023-02-10 ENCOUNTER — Encounter: Payer: No Typology Code available for payment source | Admitting: Plastic Surgery

## 2023-02-15 ENCOUNTER — Encounter: Payer: Self-pay | Admitting: Registered Nurse

## 2023-02-15 ENCOUNTER — Telehealth: Payer: Self-pay | Admitting: Registered Nurse

## 2023-02-15 DIAGNOSIS — L299 Pruritus, unspecified: Secondary | ICD-10-CM

## 2023-02-15 MED ORDER — DIPHENHYDRAMINE HCL 25 MG PO CAPS: 25.0000 mg | ORAL_CAPSULE | Freq: Four times a day (QID) | ORAL | Status: DC | PRN

## 2023-02-15 NOTE — Telephone Encounter (Signed)
Itching started on belly at work wearing new flannel shirt discussed changing shirt if another available as may be chemical/fabric irritant or had exposure to chemical off product at work and rubbed on clothing.  Denied trouble breathing/n/v/d/shortness of breath/wheezing.  Given 4 UD diphenhydramine/benadryl 25mg  from clinic stock discussed may take 1 tab every 6 hours prn itching.  Avoid scratching.  Consider ice 15 minutes prn itching.  Avoid hot/steam showers.  Follow up for immediate re-evaluation if wheezing/shortness of breath, repetitive n/v/d as could be allergic reaction call 911.  Patient in warehouse A&Ox3 spoke full sentences without difficulty observed scratching abdomen over shirt mild erythema dry warm skin respirations even and unalbored gait sure and steady A&Ox3.  Patient stated will change to a different undershirt she has available  Exitcare handout pruritus/contact dermatitis  Patient agreed with plan of care and had no further questions at this time.

## 2023-02-21 ENCOUNTER — Ambulatory Visit (INDEPENDENT_AMBULATORY_CARE_PROVIDER_SITE_OTHER): Payer: No Typology Code available for payment source | Admitting: Physician Assistant

## 2023-02-21 ENCOUNTER — Inpatient Hospital Stay (HOSPITAL_BASED_OUTPATIENT_CLINIC_OR_DEPARTMENT_OTHER)
Admission: RE | Admit: 2023-02-21 | Payer: No Typology Code available for payment source | Source: Ambulatory Visit | Admitting: Radiology

## 2023-02-21 DIAGNOSIS — F17200 Nicotine dependence, unspecified, uncomplicated: Secondary | ICD-10-CM

## 2023-02-21 DIAGNOSIS — Z1231 Encounter for screening mammogram for malignant neoplasm of breast: Secondary | ICD-10-CM

## 2023-02-21 DIAGNOSIS — N62 Hypertrophy of breast: Secondary | ICD-10-CM

## 2023-02-21 NOTE — Progress Notes (Signed)
Patient is a pleasant 42 year old female with history of macromastia who presented to our clinic on 01/19/2023 for a preoperative encounter for breast reduction surgery with Dr. Ladona Ridgel.  The surgery which had been scheduled for 02/01/2023 was canceled due to daily smoking.  Patient was agreeable to discontinue smoking and follow-up later in the year for telephone encounter to ensure that she has successfully stopped.  At that time, can proceed with rescheduling her surgery.  Today, patient joins via telephone.  The patient, Kimberly Rowland, gave permission to have this encounter performed via telemedicine, two identifiers were used to confirm patient's identity.  They also consented to chart review and treatment via this encounter.  The patient was at work in her cubicle and this provider was calling from their office.  A total of 5 minutes was spent speaking with the patient and reviewing chart.    She tells me that she has not smoked anything in 4 weeks and denies use of any nicotine containing products.  Patient reports that she was a bit worried about obtaining a mammogram since she would have had to pay out-of-pocket.  She tells me that her last mammogram was January 2024 rather than October 2023 which is what she had thought.  Reviewed chart and there does appear to be imaging in Care Everywhere from 04/07/2022 that is BI-RADS Category 1: Negative.  She does not need to obtain mammogram imaging prior to surgery as she is not yet due.  Patient to return to work for preoperative exam later this week at which time nicotine/cotinine test will be ordered.  Patient understands and will have that done prior to surgery.

## 2023-02-24 ENCOUNTER — Other Ambulatory Visit (HOSPITAL_COMMUNITY): Payer: Self-pay

## 2023-02-24 MED ORDER — DEXTROAMPHETAMINE SULFATE 10 MG PO TABS
20.0000 mg | ORAL_TABLET | Freq: Three times a day (TID) | ORAL | 0 refills | Status: DC
Start: 2023-02-25 — End: 2023-04-05
  Filled 2023-02-25: qty 180, 30d supply, fill #0

## 2023-02-25 ENCOUNTER — Other Ambulatory Visit (HOSPITAL_COMMUNITY): Payer: Self-pay

## 2023-02-25 ENCOUNTER — Encounter: Payer: No Typology Code available for payment source | Admitting: Physician Assistant

## 2023-02-25 ENCOUNTER — Ambulatory Visit (INDEPENDENT_AMBULATORY_CARE_PROVIDER_SITE_OTHER): Payer: No Typology Code available for payment source | Admitting: Physician Assistant

## 2023-02-25 VITALS — BP 160/95 | HR 118

## 2023-02-25 DIAGNOSIS — Z720 Tobacco use: Secondary | ICD-10-CM

## 2023-02-25 DIAGNOSIS — N62 Hypertrophy of breast: Secondary | ICD-10-CM

## 2023-02-25 MED ORDER — OXYCODONE HCL 5 MG PO TABS
5.0000 mg | ORAL_TABLET | Freq: Three times a day (TID) | ORAL | 0 refills | Status: AC | PRN
  Filled 2023-02-25: qty 20, 7d supply, fill #0

## 2023-02-25 MED ORDER — ONDANSETRON 4 MG PO TBDP
4.0000 mg | ORAL_TABLET | Freq: Three times a day (TID) | ORAL | 0 refills | Status: DC | PRN
  Filled 2023-02-25: qty 9, 3d supply, fill #0

## 2023-02-25 NOTE — Progress Notes (Signed)
Patient ID: Kimberly Rowland, female    DOB: 18-Jun-1980, 42 y.o.   MRN: [id number]  Chief Complaint  Patient presents with   Pre-op Exam      ICD-10-CM   1. Nicotine use  Z72.0 Nicotine/cotinine metabolites    2. Macromastia  N62        History of Present Illness: Kimberly Rowland is a 42 y.o.  female  with a history of macromastia.  She presents for preoperative evaluation for upcoming procedure, bilateral breast reduction, scheduled for 03/22/2023 with Dr.  Ladona Ridgel .  The patient has never previously had surgery or required anesthesia.  She has been free of any nicotine containing products x 5 weeks and is eager to proceed with surgery.  Will obtain nicotine/cotinine test today.  She denies any personal or family history of blood clots or clotting disorder.  She will hold ADD medication day of surgery.  PCN allergy.  Up-to-date on mammograms, last being 03/2023 and BI-RADS Category 1: Negative.  She denies any personal history of cancer, significant cardiac or pulmonary disease, previous use of anticoagulation, varicosities, or other pertinent history.  Patient tells me that she has not had any nicotine-containing products x 4 weeks.  Nicotine/cotinine order form provided to patient.  She understands that she will need to complete it no later than 03/12/2023.  Her mother will be assisting with her postoperative recovery.  Her blood pressure as well as heart rate was up for today's encounter, but she reports taking her dextroamphetamine immediately prior to arrival and states that as a contributing factor.  Regardless, she understands the importance of blood pressure control and will follow-up with her primary care provider as well as consider at home BP cuff.  Summary of Previous Visit: She was seen for initial consult by Dr. Ladona Ridgel on 10/13/2022. At that time, complained of chronic upper back and neck discomfort in the context of large breasts. STN 32 cm each side. Estimated excess breast  tissue removed at time of surgery was 500 g each side.  She then had surgery scheduled for 02/01/2023 but at consult reported that she had not yet stopped smoking.  Discussed with patient that we would need to stop immediately and then follow-up via telephone in 4 weeks to ensure that she is no longer using any nicotine products prior to rescheduling surgery.  Job: Inventory control, discussed 3 weeks FMLA.  PMH Significant for: Macromastia, ADD, tobacco use disorder.   Past Medical History: Allergies: Allergies  Allergen Reactions   Augmentin [Amoxicillin-Pot Clavulanate] Itching    Hives     Current Medications:  Current Outpatient Medications:    Cholecalciferol (VITAMIN D3) 50 MCG (2000 UT) TABS, Take 1 tablet (2,000 Units total) by mouth daily with a meal, Disp: 100 tablet, Rfl: 3   dextroamphetamine (DEXTROSTAT) 10 MG tablet, Take 2 tablets (20 mg total) by mouth 3 (three) times daily., Disp: 180 tablet, Rfl: 0   dextroamphetamine (DEXTROSTAT) 10 MG tablet, Take 2 tablets (20 mg total) by mouth 3 (three) times daily., Disp: 180 tablet, Rfl: 0   dextroamphetamine (DEXTROSTAT) 10 MG tablet, Take 2 tablets (20 mg total) by mouth 3 (three) times daily., Disp: 180 tablet, Rfl: 0   ondansetron (ZOFRAN-ODT) 4 MG disintegrating tablet, Take 1 tablet (4 mg total) by mouth every 8 (eight) hours as needed for nausea or vomiting., Disp: 20 tablet, Rfl: 0   oxyCODONE (ROXICODONE) 5 MG immediate release tablet, Take 1 tablet (5 mg total) by mouth every 8 (  eight) hours as needed for up to 7 days for severe pain (pain score 7-10)., Disp: 20 tablet, Rfl: 0   diphenhydrAMINE (BENADRYL ALLERGY) 25 mg capsule, Take 1 capsule (25 mg total) by mouth every 6 (six) hours as needed for up to 3 days for itching., Disp: , Rfl:   Past Medical Problems: Past Medical History:  Diagnosis Date   ADD (attention deficit disorder)     Past Surgical History: Past Surgical History:  Procedure Laterality Date    NO PAST SURGERIES      Social History: Social History   Socioeconomic History   Marital status: Single    Spouse name: Not on file   Number of children: 1   Years of education: Not on file   Highest education level: Not on file  Occupational History    Employer: [employer]    Comment: inventory control  Tobacco Use   Smoking status: Every Day    Current packs/day: 0.50    Types: Cigarettes   Smokeless tobacco: Not on file  Vaping Use   Vaping status: Never Used  Substance and Sexual Activity   Alcohol use: No   Drug use: No   Sexual activity: Not Currently    Partners: Male    Birth control/protection: None  Other Topics Concern   Not on file  Social History Narrative   42 y/o boy, william (2024)    Social Determinants of Health   Financial Resource Strain: Not on file  Food Insecurity: Not on file  Transportation Needs: Not on file  Physical Activity: Not on file  Stress: Not on file  Social Connections: Unknown (08/04/2021)   Received from [facility]   Social Network    Social Network: Not on file  Intimate Partner Violence: Unknown (06/26/2021)   Received from [facility], [facility]   HITS    Physically Hurt: Not on file    Insult or Talk Down To: Not on file    Threaten Physical Harm: Not on file    Scream or Curse: Not on file    Family History: Family History  Problem Relation Age of Onset   Healthy Mother    Healthy Father    Breast cancer Maternal Grandmother     Review of Systems: ROS Denies any recent fevers, chest pain, difficulty breathing, leg swelling.  Physical Exam: Vital Signs BP (!) 160/95 (BP Location: Right Arm, Patient Position: Sitting, Cuff Size: Small) Comment: Just took medication at visit. Will try to recheck, but not sure it will have time to take affect.  Pulse (!) 118   SpO2 100%   Physical Exam Constitutional:      General: Not in acute distress.    Appearance: Normal appearance. Not  ill-appearing.  HENT:     Head: Normocephalic and atraumatic.  Eyes:     Pupils: Pupils are equal, round. Cardiovascular:     Rate and Rhythm: Normal rate.    Pulses: Normal pulses.  Pulmonary:     Effort: No respiratory distress or increased work of breathing.  Speaks in full sentences. Abdominal:     General: Abdomen is flat. No distension.   Musculoskeletal: Normal range of motion. No lower extremity swelling or edema. No varicosities. Skin:    General: Skin is warm and dry.     Findings: No erythema or rash.  Neurological:     Mental Status: Alert and oriented to person, place, and time.  Psychiatric:  Mood and Affect: Mood normal.        Behavior: Behavior normal.    Assessment/Plan: The patient is scheduled for bilateral breast reduction with Dr. Domenica Reamer.  Risks, benefits, and alternatives of procedure discussed, questions answered and consent obtained.    Smoking Status: Former smoker.  She quit 4 weeks ago and does not plan to resume, even after she is fully recovered from a postoperative standpoint.  Obtaining lab screening test to ensure that there is no ongoing nicotine use prior to surgery.  Last Mammogram: 04/07/2022; Results: BI-RADS Category 1: Negative.  Caprini Score: 4; Risk Factors include: Age, BMI greater than 25, and length of planned surgery. Recommendation for mechanical prophylaxis. Encourage early ambulation.   Pictures obtained: 10/13/2022  Post-op Rx sent to pharmacy: Oxycodone, Zofran.  Patient was provided with the General Surgical Risk consent document and Pain Medication Agreement prior to their appointment.  They had adequate time to read through the risk consent documents and Pain Medication Agreement. We also discussed them in person together during this preop appointment. All of their questions were answered to their satisfaction.  Recommended calling if they have any further questions.  Risk consent form and Pain Medication Agreement to be  scanned into patient's chart.  The risk that can be encountered with breast reduction were discussed and include the following but not limited to these:  Breast asymmetry, fluid accumulation, firmness of the breast, inability to breast feed, loss of nipple or areola, skin loss, decrease or no nipple sensation, fat necrosis of the breast tissue, bleeding, infection, healing delay.  There are risks of anesthesia, changes to skin sensation and injury to nerves or blood vessels.  The muscle can be temporarily or permanently injured.  You may have an allergic reaction to tape, suture, glue, blood products which can result in skin discoloration, swelling, pain, skin lesions, poor healing.  Any of these can lead to the need for revisonal surgery or stage procedures.  A reduction has potential to interfere with diagnostic procedures.  Nipple or breast piercing can increase risks of infection.  This procedure is best done when the breast is fully developed.  Changes in the breast will continue to occur over time.  Pregnancy can alter the outcomes of previous breast reduction surgery, weight gain and weigh loss can also effect the long term appearance.     Electronically signed by: Evelena Leyden, PA-C 02/25/2023 11:43 AM

## 2023-02-25 NOTE — H&P (View-Only) (Signed)
 Patient ID: Kimberly Rowland, female    DOB: 18-Jun-1980, 42 y.o.   MRN: [id number]  Chief Complaint  Patient presents with   Pre-op Exam      ICD-10-CM   1. Nicotine use  Z72.0 Nicotine/cotinine metabolites    2. Macromastia  N62        History of Present Illness: Kimberly Rowland is a 42 y.o.  female  with a history of macromastia.  She presents for preoperative evaluation for upcoming procedure, bilateral breast reduction, scheduled for 03/22/2023 with Dr.  Ladona Ridgel .  The patient has never previously had surgery or required anesthesia.  She has been free of any nicotine containing products x 5 weeks and is eager to proceed with surgery.  Will obtain nicotine/cotinine test today.  She denies any personal or family history of blood clots or clotting disorder.  She will hold ADD medication day of surgery.  PCN allergy.  Up-to-date on mammograms, last being 03/2023 and BI-RADS Category 1: Negative.  She denies any personal history of cancer, significant cardiac or pulmonary disease, previous use of anticoagulation, varicosities, or other pertinent history.  Patient tells me that she has not had any nicotine-containing products x 4 weeks.  Nicotine/cotinine order form provided to patient.  She understands that she will need to complete it no later than 03/12/2023.  Her mother will be assisting with her postoperative recovery.  Her blood pressure as well as heart rate was up for today's encounter, but she reports taking her dextroamphetamine immediately prior to arrival and states that as a contributing factor.  Regardless, she understands the importance of blood pressure control and will follow-up with her primary care provider as well as consider at home BP cuff.  Summary of Previous Visit: She was seen for initial consult by Dr. Ladona Ridgel on 10/13/2022. At that time, complained of chronic upper back and neck discomfort in the context of large breasts. STN 32 cm each side. Estimated excess breast  tissue removed at time of surgery was 500 g each side.  She then had surgery scheduled for 02/01/2023 but at consult reported that she had not yet stopped smoking.  Discussed with patient that we would need to stop immediately and then follow-up via telephone in 4 weeks to ensure that she is no longer using any nicotine products prior to rescheduling surgery.  Job: Inventory control, discussed 3 weeks FMLA.  PMH Significant for: Macromastia, ADD, tobacco use disorder.   Past Medical History: Allergies: Allergies  Allergen Reactions   Augmentin [Amoxicillin-Pot Clavulanate] Itching    Hives     Current Medications:  Current Outpatient Medications:    Cholecalciferol (VITAMIN D3) 50 MCG (2000 UT) TABS, Take 1 tablet (2,000 Units total) by mouth daily with a meal, Disp: 100 tablet, Rfl: 3   dextroamphetamine (DEXTROSTAT) 10 MG tablet, Take 2 tablets (20 mg total) by mouth 3 (three) times daily., Disp: 180 tablet, Rfl: 0   dextroamphetamine (DEXTROSTAT) 10 MG tablet, Take 2 tablets (20 mg total) by mouth 3 (three) times daily., Disp: 180 tablet, Rfl: 0   dextroamphetamine (DEXTROSTAT) 10 MG tablet, Take 2 tablets (20 mg total) by mouth 3 (three) times daily., Disp: 180 tablet, Rfl: 0   ondansetron (ZOFRAN-ODT) 4 MG disintegrating tablet, Take 1 tablet (4 mg total) by mouth every 8 (eight) hours as needed for nausea or vomiting., Disp: 20 tablet, Rfl: 0   oxyCODONE (ROXICODONE) 5 MG immediate release tablet, Take 1 tablet (5 mg total) by mouth every 8 (  eight) hours as needed for up to 7 days for severe pain (pain score 7-10)., Disp: 20 tablet, Rfl: 0   diphenhydrAMINE (BENADRYL ALLERGY) 25 mg capsule, Take 1 capsule (25 mg total) by mouth every 6 (six) hours as needed for up to 3 days for itching., Disp: , Rfl:   Past Medical Problems: Past Medical History:  Diagnosis Date   ADD (attention deficit disorder)     Past Surgical History: Past Surgical History:  Procedure Laterality Date    NO PAST SURGERIES      Social History: Social History   Socioeconomic History   Marital status: Single    Spouse name: Not on file   Number of children: 1   Years of education: Not on file   Highest education level: Not on file  Occupational History    Employer: [employer]    Comment: inventory control  Tobacco Use   Smoking status: Every Day    Current packs/day: 0.50    Types: Cigarettes   Smokeless tobacco: Not on file  Vaping Use   Vaping status: Never Used  Substance and Sexual Activity   Alcohol use: No   Drug use: No   Sexual activity: Not Currently    Partners: Male    Birth control/protection: None  Other Topics Concern   Not on file  Social History Narrative   42 y/o boy, william (2024)    Social Determinants of Health   Financial Resource Strain: Not on file  Food Insecurity: Not on file  Transportation Needs: Not on file  Physical Activity: Not on file  Stress: Not on file  Social Connections: Unknown (08/04/2021)   Received from [facility], [facility]   Social Network    Social Network: Not on file  Intimate Partner Violence: Unknown (06/26/2021)   Received from [facility], [facility]   HITS    Physically Hurt: Not on file    Insult or Talk Down To: Not on file    Threaten Physical Harm: Not on file    Scream or Curse: Not on file    Family History: Family History  Problem Relation Age of Onset   Healthy Mother    Healthy Father    Breast cancer Maternal Grandmother     Review of Systems: ROS Denies any recent fevers, chest pain, difficulty breathing, leg swelling.  Physical Exam: Vital Signs BP (!) 160/95 (BP Location: Right Arm, Patient Position: Sitting, Cuff Size: Small) Comment: Just took medication at visit. Will try to recheck, but not sure it will have time to take affect.  Pulse (!) 118   SpO2 100%   Physical Exam Constitutional:      General: Not in acute distress.    Appearance: Normal appearance. Not  ill-appearing.  HENT:     Head: Normocephalic and atraumatic.  Eyes:     Pupils: Pupils are equal, round. Cardiovascular:     Rate and Rhythm: Normal rate.    Pulses: Normal pulses.  Pulmonary:     Effort: No respiratory distress or increased work of breathing.  Speaks in full sentences. Abdominal:     General: Abdomen is flat. No distension.   Musculoskeletal: Normal range of motion. No lower extremity swelling or edema. No varicosities. Skin:    General: Skin is warm and dry.     Findings: No erythema or rash.  Neurological:     Mental Status: Alert and oriented to person, place, and time.  Psychiatric:  Mood and Affect: Mood normal.        Behavior: Behavior normal.    Assessment/Plan: The patient is scheduled for bilateral breast reduction with Dr. Domenica Reamer.  Risks, benefits, and alternatives of procedure discussed, questions answered and consent obtained.    Smoking Status: Former smoker.  She quit 4 weeks ago and does not plan to resume, even after she is fully recovered from a postoperative standpoint.  Obtaining lab screening test to ensure that there is no ongoing nicotine use prior to surgery.  Last Mammogram: 04/07/2022; Results: BI-RADS Category 1: Negative.  Caprini Score: 4; Risk Factors include: Age, BMI greater than 25, and length of planned surgery. Recommendation for mechanical prophylaxis. Encourage early ambulation.   Pictures obtained: 10/13/2022  Post-op Rx sent to pharmacy: Oxycodone, Zofran.  Patient was provided with the General Surgical Risk consent document and Pain Medication Agreement prior to their appointment.  They had adequate time to read through the risk consent documents and Pain Medication Agreement. We also discussed them in person together during this preop appointment. All of their questions were answered to their satisfaction.  Recommended calling if they have any further questions.  Risk consent form and Pain Medication Agreement to be  scanned into patient's chart.  The risk that can be encountered with breast reduction were discussed and include the following but not limited to these:  Breast asymmetry, fluid accumulation, firmness of the breast, inability to breast feed, loss of nipple or areola, skin loss, decrease or no nipple sensation, fat necrosis of the breast tissue, bleeding, infection, healing delay.  There are risks of anesthesia, changes to skin sensation and injury to nerves or blood vessels.  The muscle can be temporarily or permanently injured.  You may have an allergic reaction to tape, suture, glue, blood products which can result in skin discoloration, swelling, pain, skin lesions, poor healing.  Any of these can lead to the need for revisonal surgery or stage procedures.  A reduction has potential to interfere with diagnostic procedures.  Nipple or breast piercing can increase risks of infection.  This procedure is best done when the breast is fully developed.  Changes in the breast will continue to occur over time.  Pregnancy can alter the outcomes of previous breast reduction surgery, weight gain and weigh loss can also effect the long term appearance.     Electronically signed by: Evelena Leyden, PA-C 02/25/2023 11:43 AM

## 2023-03-02 ENCOUNTER — Encounter: Payer: No Typology Code available for payment source | Admitting: Student

## 2023-03-03 NOTE — Telephone Encounter (Signed)
noted 

## 2023-03-04 ENCOUNTER — Other Ambulatory Visit: Payer: Self-pay

## 2023-03-04 ENCOUNTER — Encounter (HOSPITAL_BASED_OUTPATIENT_CLINIC_OR_DEPARTMENT_OTHER): Payer: Self-pay | Admitting: Plastic Surgery

## 2023-03-08 LAB — NICOTINE/COTININE METABOLITES
Cotinine: <1 ng/mL
Nicotine: <1 ng/mL

## 2023-03-17 ENCOUNTER — Telehealth: Payer: Self-pay | Admitting: Registered Nurse

## 2023-03-17 ENCOUNTER — Encounter: Payer: Self-pay | Admitting: Registered Nurse

## 2023-03-17 DIAGNOSIS — Z0000 Encounter for general adult medical examination without abnormal findings: Secondary | ICD-10-CM

## 2023-03-17 NOTE — Telephone Encounter (Signed)
Patient reported having breast reduction next week and will not be able to keep appt 03/30/23 for Hgba1c draw.  Discussed with patient will reschedule for Feb 2025 as out of office 4-6 weeks for recovery from breast reduction.  Patient agreed with plan of care and had no further questions at this time.

## 2023-03-22 ENCOUNTER — Ambulatory Visit (HOSPITAL_BASED_OUTPATIENT_CLINIC_OR_DEPARTMENT_OTHER): Payer: Self-pay | Admitting: Anesthesiology

## 2023-03-22 ENCOUNTER — Ambulatory Visit (HOSPITAL_BASED_OUTPATIENT_CLINIC_OR_DEPARTMENT_OTHER)
Admission: RE | Admit: 2023-03-22 | Discharge: 2023-03-22 | Disposition: A | Discharge status: Home / Self Care | Payer: No Typology Code available for payment source | Attending: Plastic Surgery | Admitting: Plastic Surgery

## 2023-03-22 ENCOUNTER — Ambulatory Visit (HOSPITAL_BASED_OUTPATIENT_CLINIC_OR_DEPARTMENT_OTHER): Payer: No Typology Code available for payment source | Admitting: Anesthesiology

## 2023-03-22 ENCOUNTER — Encounter (HOSPITAL_BASED_OUTPATIENT_CLINIC_OR_DEPARTMENT_OTHER): Payer: Self-pay | Admitting: Plastic Surgery

## 2023-03-22 ENCOUNTER — Other Ambulatory Visit: Payer: Self-pay

## 2023-03-22 ENCOUNTER — Encounter (HOSPITAL_BASED_OUTPATIENT_CLINIC_OR_DEPARTMENT_OTHER)
Admission: RE | Disposition: A | Discharge status: Home / Self Care | Payer: Self-pay | Source: Home / Self Care | Attending: Plastic Surgery

## 2023-03-22 DIAGNOSIS — Z880 Allergy status to penicillin: Secondary | ICD-10-CM | POA: Insufficient documentation

## 2023-03-22 DIAGNOSIS — Z87891 Personal history of nicotine dependence: Secondary | ICD-10-CM | POA: Insufficient documentation

## 2023-03-22 DIAGNOSIS — N62 Hypertrophy of breast: Secondary | ICD-10-CM | POA: Insufficient documentation

## 2023-03-22 DIAGNOSIS — Z01818 Encounter for other preprocedural examination: Secondary | ICD-10-CM

## 2023-03-22 HISTORY — PX: BREAST REDUCTION SURGERY: SHX8

## 2023-03-22 LAB — POCT PREGNANCY, URINE: Preg Test, Ur: NEGATIVE

## 2023-03-22 SURGERY — MAMMOPLASTY, REDUCTION
Anesthesia: General | Site: Breast | Laterality: Bilateral

## 2023-03-22 MED ORDER — SUGAMMADEX SODIUM 200 MG/2ML IV SOLN
INTRAVENOUS | Status: DC | PRN
  Administered 2023-03-22: 200 mg via INTRAVENOUS

## 2023-03-22 MED ORDER — HYDROMORPHONE HCL 1 MG/ML IJ SOLN
0.2500 mg | INTRAMUSCULAR | Status: DC | PRN
  Administered 2023-03-22: 0.5 mg via INTRAVENOUS

## 2023-03-22 MED ORDER — KETAMINE HCL 50 MG/5ML IJ SOSY
PREFILLED_SYRINGE | INTRAMUSCULAR | Status: AC
  Filled 2023-03-22: qty 5

## 2023-03-22 MED ORDER — ROCURONIUM BROMIDE 10 MG/ML (PF) SYRINGE
PREFILLED_SYRINGE | INTRAVENOUS | Status: AC
  Filled 2023-03-22: qty 10

## 2023-03-22 MED ORDER — DEXAMETHASONE SODIUM PHOSPHATE 10 MG/ML IJ SOLN
INTRAMUSCULAR | Status: AC
  Filled 2023-03-22: qty 1

## 2023-03-22 MED ORDER — ACETAMINOPHEN 10 MG/ML IV SOLN
INTRAVENOUS | Status: DC | PRN
  Administered 2023-03-22: 1000 mg via INTRAVENOUS

## 2023-03-22 MED ORDER — MIDAZOLAM HCL 2 MG/2ML IJ SOLN
INTRAMUSCULAR | Status: AC
  Filled 2023-03-22: qty 2

## 2023-03-22 MED ORDER — SCOPOLAMINE 1 MG/3DAYS TD PT72
1.0000 | MEDICATED_PATCH | TRANSDERMAL | Status: DC
  Administered 2023-03-22: 1.5 mg via TRANSDERMAL

## 2023-03-22 MED ORDER — FENTANYL CITRATE (PF) 100 MCG/2ML IJ SOLN
INTRAMUSCULAR | Status: AC
  Filled 2023-03-22: qty 2

## 2023-03-22 MED ORDER — LACTATED RINGERS IV SOLN: INTRAVENOUS | Status: DC

## 2023-03-22 MED ORDER — ONDANSETRON HCL 4 MG/2ML IJ SOLN: 4.0000 mg | Freq: Once | INTRAMUSCULAR | Status: DC | PRN

## 2023-03-22 MED ORDER — ROCURONIUM BROMIDE 100 MG/10ML IV SOLN
INTRAVENOUS | Status: DC | PRN
  Administered 2023-03-22: 50 mg via INTRAVENOUS
  Administered 2023-03-22 (×2): 10 mg via INTRAVENOUS

## 2023-03-22 MED ORDER — CHLORHEXIDINE GLUCONATE CLOTH 2 % EX PADS: 6.0000 | MEDICATED_PAD | Freq: Once | CUTANEOUS | Status: DC

## 2023-03-22 MED ORDER — CHLORHEXIDINE GLUCONATE CLOTH 2 % EX PADS
6.0000 | MEDICATED_PAD | Freq: Once | CUTANEOUS | Status: DC
Start: 2023-03-22 — End: 2023-03-22

## 2023-03-22 MED ORDER — SODIUM CHLORIDE (PF) 0.9 % IJ SOLN
INTRAMUSCULAR | Status: DC | PRN
  Administered 2023-03-22 (×2): 50 mL

## 2023-03-22 MED ORDER — AMISULPRIDE (ANTIEMETIC) 5 MG/2ML IV SOLN: 10.0000 mg | Freq: Once | INTRAVENOUS | Status: DC | PRN

## 2023-03-22 MED ORDER — SUCCINYLCHOLINE CHLORIDE 200 MG/10ML IV SOSY
PREFILLED_SYRINGE | INTRAVENOUS | Status: AC
  Filled 2023-03-22: qty 10

## 2023-03-22 MED ORDER — PHENYLEPHRINE 80 MCG/ML (10ML) SYRINGE FOR IV PUSH (FOR BLOOD PRESSURE SUPPORT)
PREFILLED_SYRINGE | INTRAVENOUS | Status: AC
  Filled 2023-03-22: qty 10

## 2023-03-22 MED ORDER — MIDAZOLAM HCL 5 MG/5ML IJ SOLN
INTRAMUSCULAR | Status: DC | PRN
  Administered 2023-03-22: 2 mg via INTRAVENOUS

## 2023-03-22 MED ORDER — ATROPINE SULFATE 0.4 MG/ML IV SOLN
INTRAVENOUS | Status: AC
  Filled 2023-03-22: qty 1

## 2023-03-22 MED ORDER — 0.9 % SODIUM CHLORIDE (POUR BTL) OPTIME
TOPICAL | Status: DC | PRN
  Administered 2023-03-22: 1000 mL
  Administered 2023-03-22: 400 mL
  Administered 2023-03-22: 1000 mL

## 2023-03-22 MED ORDER — OXYCODONE HCL 5 MG PO TABS
ORAL_TABLET | ORAL | Status: AC
  Filled 2023-03-22: qty 1

## 2023-03-22 MED ORDER — ONDANSETRON HCL 4 MG/2ML IJ SOLN
INTRAMUSCULAR | Status: DC | PRN
  Administered 2023-03-22: 4 mg via INTRAVENOUS

## 2023-03-22 MED ORDER — KETAMINE HCL 10 MG/ML IJ SOLN
INTRAMUSCULAR | Status: DC | PRN
  Administered 2023-03-22: 20 mg via INTRAVENOUS
  Administered 2023-03-22: 10 mg via INTRAVENOUS

## 2023-03-22 MED ORDER — OXYCODONE HCL 5 MG/5ML PO SOLN: 5.0000 mg | Freq: Once | ORAL | Status: AC | PRN

## 2023-03-22 MED ORDER — VANCOMYCIN HCL IN DEXTROSE 1-5 GM/200ML-% IV SOLN
INTRAVENOUS | Status: AC
  Filled 2023-03-22: qty 200

## 2023-03-22 MED ORDER — HYDROMORPHONE HCL 1 MG/ML IJ SOLN
INTRAMUSCULAR | Status: AC
  Filled 2023-03-22: qty 0.5

## 2023-03-22 MED ORDER — EPHEDRINE 5 MG/ML INJ
INTRAVENOUS | Status: AC
  Filled 2023-03-22: qty 5

## 2023-03-22 MED ORDER — SUGAMMADEX SODIUM 200 MG/2ML IV SOLN: INTRAVENOUS | Status: DC | PRN

## 2023-03-22 MED ORDER — ACETAMINOPHEN 10 MG/ML IV SOLN
1000.0000 mg | Freq: Once | INTRAVENOUS | Status: DC | PRN
Start: 2023-03-22 — End: 2023-03-22

## 2023-03-22 MED ORDER — OXYCODONE HCL 5 MG PO TABS
5.0000 mg | ORAL_TABLET | Freq: Once | ORAL | Status: AC | PRN
  Administered 2023-03-22: 5 mg via ORAL

## 2023-03-22 MED ORDER — PROPOFOL 10 MG/ML IV BOLUS
INTRAVENOUS | Status: DC | PRN
  Administered 2023-03-22: 150 mg via INTRAVENOUS

## 2023-03-22 MED ORDER — ACETAMINOPHEN 10 MG/ML IV SOLN
INTRAVENOUS | Status: AC
  Filled 2023-03-22: qty 100

## 2023-03-22 MED ORDER — LIDOCAINE HCL (CARDIAC) PF 100 MG/5ML IV SOSY
PREFILLED_SYRINGE | INTRAVENOUS | Status: DC | PRN
  Administered 2023-03-22: 100 mg via INTRAVENOUS

## 2023-03-22 MED ORDER — LIDOCAINE 2% (20 MG/ML) 5 ML SYRINGE
INTRAMUSCULAR | Status: AC
  Filled 2023-03-22: qty 5

## 2023-03-22 MED ORDER — SODIUM CHLORIDE 0.9 % IV SOLN: INTRAVENOUS | Status: DC | PRN

## 2023-03-22 MED ORDER — DEXAMETHASONE SODIUM PHOSPHATE 4 MG/ML IJ SOLN
INTRAMUSCULAR | Status: DC | PRN
  Administered 2023-03-22: 5 mg via INTRAVENOUS

## 2023-03-22 MED ORDER — SCOPOLAMINE 1 MG/3DAYS TD PT72
MEDICATED_PATCH | TRANSDERMAL | Status: AC
  Filled 2023-03-22: qty 1

## 2023-03-22 MED ORDER — VANCOMYCIN HCL IN DEXTROSE 1-5 GM/200ML-% IV SOLN
1000.0000 mg | INTRAVENOUS | Status: AC
  Administered 2023-03-22: 1000 mg via INTRAVENOUS

## 2023-03-22 MED ORDER — FENTANYL CITRATE (PF) 100 MCG/2ML IJ SOLN
INTRAMUSCULAR | Status: DC | PRN
  Administered 2023-03-22: 25 ug via INTRAVENOUS
  Administered 2023-03-22: 100 ug via INTRAVENOUS
  Administered 2023-03-22: 50 ug via INTRAVENOUS
  Administered 2023-03-22: 25 ug via INTRAVENOUS
  Administered 2023-03-22: 50 ug via INTRAVENOUS

## 2023-03-22 MED ORDER — ONDANSETRON HCL 4 MG/2ML IJ SOLN
INTRAMUSCULAR | Status: AC
  Filled 2023-03-22: qty 2

## 2023-03-22 SURGICAL SUPPLY — 57 items
BINDER BREAST 3XL (GAUZE/BANDAGES/DRESSINGS) IMPLANT
BINDER BREAST LRG (GAUZE/BANDAGES/DRESSINGS) IMPLANT
BINDER BREAST MEDIUM (GAUZE/BANDAGES/DRESSINGS) IMPLANT
BINDER BREAST XLRG (GAUZE/BANDAGES/DRESSINGS) IMPLANT
BINDER BREAST XXLRG (GAUZE/BANDAGES/DRESSINGS) IMPLANT
BIOPATCH RED 1 DISK 7.0 (GAUZE/BANDAGES/DRESSINGS) ×2 IMPLANT
BLADE SURG 10 STRL SS (BLADE) ×6 IMPLANT
BLADE SURG 15 STRL LF DISP TIS (BLADE) ×1 IMPLANT
CANISTER SUCT 1200ML W/VALVE (MISCELLANEOUS) ×1 IMPLANT
DERMABOND ADVANCED .7 DNX12 (GAUZE/BANDAGES/DRESSINGS) ×2 IMPLANT
DRAIN CHANNEL 19F RND (DRAIN) ×2 IMPLANT
DRAPE IMP U-DRAPE 54X76 (DRAPES) IMPLANT
DRAPE UTILITY XL STRL (DRAPES) ×1 IMPLANT
DRSG TEGADERM 4X4.75 (GAUZE/BANDAGES/DRESSINGS) ×2 IMPLANT
ELECT BLADE 4.0 EZ CLEAN MEGAD (MISCELLANEOUS) ×1
ELECT REM PT RETURN 9FT ADLT (ELECTROSURGICAL) ×2
ELECTRODE BLDE 4.0 EZ CLN MEGD (MISCELLANEOUS) ×1 IMPLANT
ELECTRODE REM PT RTRN 9FT ADLT (ELECTROSURGICAL) ×2 IMPLANT
EVACUATOR SILICONE 100CC (DRAIN) ×2 IMPLANT
GAUZE PAD ABD 8X10 STRL (GAUZE/BANDAGES/DRESSINGS) ×4 IMPLANT
GAUZE SPONGE 2X2 STRL 8-PLY (GAUZE/BANDAGES/DRESSINGS) ×2 IMPLANT
GLOVE BIO SURGEON STRL SZ 6.5 (GLOVE) IMPLANT
GLOVE BIO SURGEON STRL SZ7.5 (GLOVE) IMPLANT
GLOVE BIO SURGEON STRL SZ8 (GLOVE) ×1 IMPLANT
GLOVE BIOGEL PI IND STRL 6.5 (GLOVE) IMPLANT
GLOVE BIOGEL PI IND STRL 7.0 (GLOVE) IMPLANT
GLOVE BIOGEL PI IND STRL 8 (GLOVE) ×1 IMPLANT
GLOVE SURG SS PI 6.5 STRL IVOR (GLOVE) IMPLANT
GOWN STRL REUS W/ TWL LRG LVL3 (GOWN DISPOSABLE) ×1 IMPLANT
GOWN STRL REUS W/TWL XL LVL3 (GOWN DISPOSABLE) ×1 IMPLANT
HEMOSTAT ARISTA ABSORB 3G PWDR (HEMOSTASIS) IMPLANT
HIBICLENS CHG 4% 4OZ BTL (MISCELLANEOUS) ×1 IMPLANT
MARKER SKIN DUAL TIP RULER LAB (MISCELLANEOUS) ×1 IMPLANT
NDL HYPO 22X1.5 SAFETY MO (MISCELLANEOUS) ×2 IMPLANT
NEEDLE HYPO 22X1.5 SAFETY MO (MISCELLANEOUS) ×2 IMPLANT
NS IRRIG 1000ML POUR BTL (IV SOLUTION) ×1 IMPLANT
PACK BASIN DAY SURGERY FS (CUSTOM PROCEDURE TRAY) ×1 IMPLANT
PACK UNIVERSAL I (CUSTOM PROCEDURE TRAY) ×1 IMPLANT
PENCIL SMOKE EVACUATOR (MISCELLANEOUS) ×2 IMPLANT
PIN SAFETY STERILE (MISCELLANEOUS) ×1 IMPLANT
SLEEVE SCD COMPRESS KNEE MED (STOCKING) ×1 IMPLANT
SPONGE T-LAP 18X18 ~~LOC~~+RFID (SPONGE) ×3 IMPLANT
STAPLER SKIN PROX WIDE 3.9 (STAPLE) ×1 IMPLANT
SUT MNCRL AB 3-0 PS2 27 (SUTURE) ×4 IMPLANT
SUT MNCRL AB 4-0 PS2 18 (SUTURE) ×4 IMPLANT
SUT MON AB 2-0 CT1 36 (SUTURE) ×1 IMPLANT
SUT MON AB 5-0 PS2 18 (SUTURE) IMPLANT
SUT SILK 2 0 SH (SUTURE) ×2 IMPLANT
SUT VIC AB 3-0 SH 27X BRD (SUTURE) IMPLANT
SYR 20ML LL LF (SYRINGE) ×2 IMPLANT
SYR BULB IRRIG 60ML STRL (SYRINGE) ×1 IMPLANT
SYR CONTROL 10ML LL (SYRINGE) ×1 IMPLANT
TOWEL GREEN STERILE FF (TOWEL DISPOSABLE) ×2 IMPLANT
TRAY DSU PREP LF (CUSTOM PROCEDURE TRAY) ×1 IMPLANT
TUBE CONNECTING 20X1/4 (TUBING) ×1 IMPLANT
UNDERPAD 30X36 HEAVY ABSORB (UNDERPADS AND DIAPERS) ×2 IMPLANT
YANKAUER SUCT BULB TIP NO VENT (SUCTIONS) ×1 IMPLANT

## 2023-03-22 NOTE — Transfer of Care (Signed)
 Immediate Anesthesia Transfer of Care Note  Patient: Kimberly Rowland  Procedure(s) Performed: MAMMARY REDUCTION  (BREAST) (Bilateral: Breast)  Patient Location: PACU  Anesthesia Type:General  Level of Consciousness: awake, drowsy, and patient cooperative  Airway & Oxygen Therapy: Patient Spontanous Breathing and Patient connected to face mask oxygen  Post-op Assessment: Report given to RN and Post -op Vital signs reviewed and stable  Post vital signs: Reviewed and stable  Last Vitals:  Vitals Value Taken Time  BP    Temp    Pulse    Resp 25 03/22/23 1515  SpO2    Vitals shown include unfiled device data.  Last Pain:  Vitals:   03/22/23 0904  TempSrc: Temporal  PainSc: 0-No pain         Complications: No notable events documented.

## 2023-03-22 NOTE — Interval H&P Note (Signed)
 History and Physical Interval Note: No change in exam or indication for surgery Marked for a bilateral breast reduction with her assistance All questions answered. Will proceed at her request   03/22/2023 10:56 AM  Kimberly Rowland  has presented today for surgery, with the diagnosis of Macromastia.  The various methods of treatment have been discussed with the patient and family. After consideration of risks, benefits and other options for treatment, the patient has consented to  Procedure(s): MAMMARY REDUCTION  (BREAST) (Bilateral) as a surgical intervention.  The patient's history has been reviewed, patient examined, no change in status, stable for surgery.  I have reviewed the patient's chart and labs.  Questions were answered to the patient's satisfaction.     Leonce KATHEE [place]

## 2023-03-22 NOTE — Discharge Instructions (Addendum)
 INSTRUCTIONS FOR AFTER BREAST SURGERY   You will likely have some questions about what to expect following your operation.  The following information will help you and your family understand what to expect when you are discharged from the hospital.  It is important to follow these guidelines to help ensure a smooth recovery and reduce complication.  Postoperative instructions include information on: diet, wound care, medications and physical activity.  AFTER SURGERY Expect to go home after the procedure.  In some cases, you may need to spend one night in the hospital for observation.  DIET Breast surgery does not require a specific diet.  However, the healthier you eat the better your body will heal. It is important to increasing your protein intake.  This means limiting the foods with sugar and carbohydrates.  Focus on vegetables and some meat.  If you have liposuction during your procedure be sure to drink water.  If your urine is bright yellow, then it is concentrated, and you need to drink more water.  As a general rule after surgery, you should have 8 ounces of water every hour while awake.  If you find you are persistently nauseated or unable to take in liquids let us  know.  NO TOBACCO USE or EXPOSURE.  This will slow your healing process and lead to a wound.  WOUND CARE Leave the binder on at all times except when showering . Use fragrance free soap like Dial, Dove or Ivory.   After 24 hours you can remove the binder to shower. Once dry apply binder or sports bra. No baths, pools or hot tubs for four weeks. We close your incision to leave the smallest and best-looking scar. No ointment or creams on your incisions for four weeks.  No Neosporin (Too many skin reactions).  A few weeks after surgery you can use Mederma and start massaging the scar. We ask you to wear your binder or sports bra for the first 6 weeks around the clock, including while sleeping. This provides added comfort and helps  reduce the fluid accumulation at the surgery site. NO Ice or heating pads to the operative site.  You have a very high risk of a BURN before you feel the temperature change. Continue to empty, recharge, & record drainage from drains 2-3 times a day, as needed.  ACTIVITY No heavy lifting until cleared by the doctor.  This usually means no more than a half-gallon of milk.  It is OK to walk and climb stairs. Moving your legs is very important to decrease your risk of a blood clot.  It will also help keep you from getting deconditioned.  Every 1 to 2 hours get up and walk for 5 minutes. This will help with a quicker recovery back to normal.  Let pain be your guide so you don't do too much.  This time is for you to recover.  You will be more comfortable if you sleep and rest with your head elevated either with a few pillows under you or in a recliner.  No stomach sleeping for a three months.  WORK Everyone returns to work at different times. As a rough guide, most people take at least 1 - 2 weeks off prior to returning to work. If you need documentation for your job, give the forms to the front staff at the clinic.  DRIVING Arrange for someone to bring you home from the hospital after your surgery.  You may be able to drive a few days after  surgery but not while taking any narcotics or valium.  BOWEL MOVEMENTS Constipation can occur after anesthesia and while taking pain medication.  It is important to stay ahead for your comfort.  We recommend taking Milk of Magnesia (2 tablespoons; twice a day) while taking the pain pills.  MEDICATIONS You may be prescribed should start after surgery At your preoperative visit for you history and physical you may have been given the following medications: Zofran  4 mg:  This is to treat nausea and vomiting.  You can take this every 6 hours as needed and only if needed. Oxycodone  5 mg:  This is only to be used after you have taken the Motrin  or the Tylenol . Every 8  hours as needed.   Over the counter Medication to take: Ibuprofen  (Motrin ) 600 mg:  Take this every 6 hours.  If you have additional pain then take 500 mg of the Tylenol  every 8 hours.  Only take the Norco after you have tried these two. MiraLAX or Milk of Magnesia: Take this according to the bottle if you take the Norco.  WHEN TO CALL Call your surgeon's office if any of the following occur: Fever 101 degrees F or greater Excessive bleeding or fluid from the incision site. Pain that increases over time without aid from the medications Redness, warmth, or pus draining from incision sites Persistent nausea or inability to take in liquids Severe misshapen area that underwent the operation.  Here are some resources for breast cancer patients:  Plastic surgery website: https://www.plasticsurgery.org/for-medical-professionals/education-and-resources/publications/breast-reconstruction-magazine Breast Reconstruction Awareness Campaign:  chesscontest.fr Plastic surgery Implant information:  https://www.plasticsurgery.org/patient-safety/breast-implant-safety   No Tylenol  until after 7:37 pmAbout my Jackson-Pratt Bulb Drain  What is a Jackson-Pratt bulb? A Jackson-Pratt is a soft, round device used to collect drainage. It is connected to a long, thin drainage catheter, which is held in place by one or two small stiches near your surgical incision site. When the bulb is squeezed, it forms a vacuum, forcing the drainage to empty into the bulb.  Emptying the Jackson-Pratt bulb- To empty the bulb: 1. Release the plug on the top of the bulb. 2. Pour the bulb's contents into a measuring container which your nurse will provide. 3. Record the time emptied and amount of drainage. Empty the drain(s) as often as your     doctor or nurse recommends.  Date                  Time                    Amount (Drain 1)                 Amount (Drain  2)  _____________________________________________________________________  _____________________________________________________________________  _____________________________________________________________________  _____________________________________________________________________  _____________________________________________________________________  _____________________________________________________________________  _____________________________________________________________________  _____________________________________________________________________  Squeezing the Jackson-Pratt Bulb- To squeeze the bulb: 1. Make sure the plug at the top of the bulb is open. 2. Squeeze the bulb tightly in your fist. You will hear air squeezing from the bulb. 3. Replace the plug while the bulb is squeezed. 4. Use a safety pin to attach the bulb to your clothing. This will keep the catheter from     pulling at the bulb insertion site.  When to call your doctor- Call your doctor if: Drain site becomes red, swollen or hot. You have a fever greater than 101 degrees F. There is oozing at the drain site. Drain falls out (apply a guaze bandage over the drain hole and  secure it with tape). Drainage increases daily not related to activity patterns. (You will usually have more drainage when you are active than when you are resting.) Drainage has a bad odor.

## 2023-03-22 NOTE — Anesthesia Postprocedure Evaluation (Signed)
 Anesthesia Post Note  Patient: Kimberly Rowland  Procedure(s) Performed: MAMMARY REDUCTION  (BREAST) (Bilateral: Breast)     Patient location during evaluation: PACU Anesthesia Type: General Level of consciousness: awake and alert Pain management: pain level controlled Vital Signs Assessment: post-procedure vital signs reviewed and stable Respiratory status: spontaneous breathing, nonlabored ventilation, respiratory function stable and patient connected to nasal cannula oxygen Cardiovascular status: blood pressure returned to baseline and stable Postop Assessment: no apparent nausea or vomiting Anesthetic complications: no   No notable events documented.  Last Vitals:  Vitals:   03/22/23 1530 03/22/23 1545  BP: (!) 144/96 (!) 144/92  Pulse: 86 85  Resp: 12 10  Temp: 36.7 C   SpO2: 97% 96%    Last Pain:  Vitals:   03/22/23 1545  TempSrc:   PainSc: 4                  Garnette DELENA Gab

## 2023-03-22 NOTE — Anesthesia Procedure Notes (Signed)
 Procedure Name: Intubation Date/Time: 03/22/2023 11:17 AM  Performed by: Emilio Rock BIRCH, CRNAPre-anesthesia Checklist: Patient identified, Emergency Drugs available, Suction available and Patient being monitored Patient Re-evaluated:Patient Re-evaluated prior to induction Oxygen Delivery Method: Circle system utilized Preoxygenation: Pre-oxygenation with 100% oxygen Induction Type: IV induction Ventilation: Mask ventilation without difficulty Laryngoscope Size: Mac and 3 Grade View: Grade I Tube type: Oral Tube size: 7.0 mm Number of attempts: 1 Airway Equipment and Method: Stylet and Oral airway Placement Confirmation: ETT inserted through vocal cords under direct vision, positive ETCO2 and breath sounds checked- equal and bilateral Secured at: 22 cm Tube secured with: Tape Dental Injury: Teeth and Oropharynx as per pre-operative assessment

## 2023-03-22 NOTE — Op Note (Signed)
 DATE OF OPERATION: 03/22/2023  LOCATION: [facility] operating Room  PREOPERATIVE DIAGNOSIS: Symptomatic macromastia  POSTOPERATIVE DIAGNOSIS: Same  PROCEDURE: Bilateral breast reduction  SURGEON: Marinell Birmingham, MD  ASSISTANT: Estefana Peck  EBL: 150 cc  CONDITION: Stable  COMPLICATIONS: None  INDICATION: The patient, Kimberly Rowland, is a 42 y.o. female born on [birth year], is here for treatment of upper back and neck pain secondary to large breast size.   PROCEDURE DETAILS:  The patient was seen prior to surgery and marked.   IV antibiotics were given. The patient was taken to the operating room and given a general anesthetic. A standard time out was performed and all information was confirmed by those in the room. SCDs were placed.   The chest was prepped and draped in usual sterile manner.  A 42 mm cookie cutter was used to outline both nipple areolar complexes and an 8 cm based inferior pedicle was drawn on each breast.  The right breast was addressed first.  The pedicle was de-epithelialized sharply.  The electrocautery was used to define the borders of the pedicle to the chest wall.  Electrocautery was then used to resect the medial lateral and superior triangles of breast tissue and to develop and thin the superior skin flap.  This constituted the bulk of the reduction.  The breast tissue weighed 836 g.  The wound was irrigated with warm normal saline and hemostasis achieved with the electrocautery.  A mixture of Exparel , quarter percent Marcaine , saline was then infiltrated into the subcutaneous tissues and in the subfascial space over the pectoralis muscle.  A total of 50 mL was used.  A 19 French round drain was placed behind the pedicle and brought out through a separate stab incision.  The T point was approximated with a single 2-0 Monocryl suture and the skin edges tailor tacked in place with skin clips.  The dermis was then closed with interrupted and running 3-0 Monocryl  sutures and the skin was closed with a running 4-0 Monocryl suture.  Attention was turned to the left side where similar procedure was performed.  After de-epithelializing the pedicle the borders of the pedicle were dissected down to the chest wall.  The medial lateral and superior triangles of breast tissue were resected and the superior skin flap elevated and thinned with the electrocautery.  The breast tissue removed constituted the bulk of the reduction and weighed 821 g.  Again the wound was irrigated with saline and hemostasis achieved.  The subcutaneous tissues and subfascial space were infiltrated with the Exparel  mixture.  The 96 French round drain was placed behind the pedicle and brought out through a separate stab incision.  The T point was approximated with a 2-0 Monocryl suture and the skin edges tailor tacked in place with skin clips.  The dermis was approximated with interrupted and running 3-0 Monocryl sutures and the skin was closed with a running 4-0 Monocryl subcuticular stitch.  The incisions were sealed with Dermabond and the patient was placed in a supportive compressive garment.  She was awakened from anesthesia without incident transferred to the recovery room in good condition.  All instrument needle and sponge counts were reported as correct and no complications were appreciated. The patient was allowed to wake up and taken to recovery room in stable condition at the end of the case. The family was notified at the end of the case.   The advanced practice practitioner (APP) assisted throughout the case.  The APP was essential  in retraction and counter traction when needed to make the case progress smoothly.  This retraction and assistance made it possible to see the tissue plans for the procedure.  The assistance was needed for blood control, tissue re-approximation and assisted with closure of the incision site.

## 2023-03-23 ENCOUNTER — Encounter (HOSPITAL_BASED_OUTPATIENT_CLINIC_OR_DEPARTMENT_OTHER): Payer: Self-pay | Admitting: Plastic Surgery

## 2023-03-24 ENCOUNTER — Ambulatory Visit (INDEPENDENT_AMBULATORY_CARE_PROVIDER_SITE_OTHER): Payer: No Typology Code available for payment source | Admitting: Surgical

## 2023-03-24 ENCOUNTER — Encounter: Payer: No Typology Code available for payment source | Admitting: Surgical

## 2023-03-24 DIAGNOSIS — N62 Hypertrophy of breast: Secondary | ICD-10-CM

## 2023-03-24 NOTE — Progress Notes (Signed)
 43 year old female here for follow-up after bilateral breast reduction 2 days ago with Dr. Waddell.  She is here today for initial postop visit and possible drain removal.  She is here with her mother.  She reports she is overall doing well, she is not having any infectious symptoms.  Pain is well-controlled.    She has been up and active, mom has been making sure that she has been ambulating frequently.  She does not report any specific concerns today.  Reports JP drain output has been approximately 40 to 60 cc per 24 hours.  Chaperone present on exam On exam bilateral NAC's are viable, bilateral breast incisions are intact and healing well.  Bilateral JP drains in place with serosanguineous drainage.  She does have significant ecchymosis noted on the right inferior breast along the T-junction and vertical limb.  She does have what appears to be some blistering along the vertical limb/IMF junction incision that is approximately 3.5 x 1.5 cm.  I do not appreciate any subcutaneous fluid collection with palpation.  She does have some right lateral breast fullness.  The left breast also has some ecchymosis present, but no blistering noted.  There is no erythema or cellulitic changes noted of either breast.  A/P:  Bilateral JP drains were removed, patient tolerated this well.  Patient does have significant ecchymosis of the right inferior breast with some blistering present, discussed with patient and her mother that this will likely develop into a wound and recommend beginning with Vaseline to this area.  We discussed following up next week for reevaluation.  Pictures were taken and placed in the patient's chart with patient's permission.  There is no signs of infection or concern on exam.

## 2023-03-25 ENCOUNTER — Other Ambulatory Visit (HOSPITAL_COMMUNITY): Payer: Self-pay

## 2023-03-25 LAB — SURGICAL PATHOLOGY

## 2023-03-25 MED ORDER — DEXTROAMPHETAMINE SULFATE 10 MG PO TABS
20.0000 mg | ORAL_TABLET | Freq: Three times a day (TID) | ORAL | 0 refills | Status: DC
  Filled 2023-03-28: qty 180, 30d supply, fill #0

## 2023-03-27 NOTE — Progress Notes (Signed)
 noted

## 2023-03-28 ENCOUNTER — Other Ambulatory Visit (HOSPITAL_COMMUNITY): Payer: Self-pay

## 2023-03-28 ENCOUNTER — Other Ambulatory Visit: Payer: Self-pay

## 2023-03-30 ENCOUNTER — Other Ambulatory Visit: Payer: No Typology Code available for payment source

## 2023-03-30 ENCOUNTER — Encounter: Payer: Self-pay | Admitting: Plastic Surgery

## 2023-03-30 ENCOUNTER — Ambulatory Visit (INDEPENDENT_AMBULATORY_CARE_PROVIDER_SITE_OTHER): Payer: No Typology Code available for payment source | Admitting: Plastic Surgery

## 2023-03-30 VITALS — BP 159/105 | HR 133 | Ht 64.0 in | Wt 165.4 lb

## 2023-03-30 DIAGNOSIS — Z9889 Other specified postprocedural states: Secondary | ICD-10-CM

## 2023-03-30 DIAGNOSIS — S21001S Unspecified open wound of right breast, sequela: Secondary | ICD-10-CM

## 2023-03-30 NOTE — Progress Notes (Signed)
 Kimberly Rowland returns today 8 days postop from a bilateral breast reduction.  Despite having overall very nice shape and size she has developed an area of skin necrosis on the right breast on the medial flap.  This area is approximately 2 x 7 cm.   Currently the skin is dry and clean and still intact.  I would prefer to leave this as a biologic dressing at the moment.  She will continue to coat the area with Vaseline.  Will wait for the skin to separate and remove on its own.  She understands that there will be a period of wound healing once this area of skin separates.  I do believe that ultimately her aesthetic outcome will be perfectly acceptable.  Follow-up in 1 week.

## 2023-04-04 ENCOUNTER — Encounter: Payer: Self-pay | Admitting: Student

## 2023-04-04 ENCOUNTER — Telehealth: Payer: Self-pay | Admitting: Plastic Surgery

## 2023-04-04 NOTE — Progress Notes (Addendum)
 Patient is a 43 year old female who underwent bilateral breast reduction with Dr. Waddell on 03/22/2023.  She walked into our clinic today with concerns about her wound smelling.  Nursing staff spoke with patient.  She denies any fevers, chills, nausea, vomiting.  She denies any increased pain or redness to the area.  We will have the patient follow back up for her scheduled appointment tomorrow.  Appointment has been scheduled with the front desk.  Patient to call or message back if she has any further questions or concerns.

## 2023-04-04 NOTE — Telephone Encounter (Signed)
 Pt needs letter saying she is cleared to go back to work, and can lift over 20 lbs. She says she needs the letter to be signed by provider

## 2023-04-05 ENCOUNTER — Telehealth: Payer: Self-pay

## 2023-04-05 ENCOUNTER — Encounter: Payer: Self-pay | Admitting: Student

## 2023-04-05 ENCOUNTER — Ambulatory Visit (INDEPENDENT_AMBULATORY_CARE_PROVIDER_SITE_OTHER): Payer: No Typology Code available for payment source | Admitting: Student

## 2023-04-05 VITALS — BP 150/91 | HR 89

## 2023-04-05 DIAGNOSIS — Z9889 Other specified postprocedural states: Secondary | ICD-10-CM

## 2023-04-05 NOTE — Telephone Encounter (Signed)
 Fax sent with confirmation receipt to [facility] for wound care supplies.

## 2023-04-05 NOTE — Progress Notes (Signed)
 Patient is a 43 year old female who underwent bilateral breast reduction with Dr. Waddell on 03/22/2023.  She is 2 weeks postop.  She presents to the clinic today with concerns about a wound.  Patient was last seen in the clinic on 03/30/2023.  At this visit, patient was noted to have an approximately 2 x 7 cm area of skin necrosis to the medial flap of the right breast.  Currently the skin was clean and still intact.  It was discussed with the patient that she should continue to coat the area with Vaseline and wait for the skin to separate on its own.  Today, patient is doing well.  She reports that there is a little bit of an odor to her wound.  She denies any other issues or concerns.  Denies any drainage.  Denies any fevers or chills.  Denies any increased pain.  Reports she has been applying Vaseline to her wound.  Chaperone present on exam.  On exam, patient is sitting upright in no acute distress.  Breasts are soft and fairly symmetric.  There is no overlying erythema.  No obvious fluid collections palpated on exam.  NAC's appear to be healthy bilaterally.  There appears to be a 5 x 6 cm area of skin necrosis just medial to the vertical limb incision of the right breast.  There is a little bit of irritation surrounding the wound.  No active drainage.  No tenderness to palpation.  Right breast incisions are otherwise intact.  To the left breast, there does appear to be a little bit of superficial dehiscence to the vertical limb incision as well as a superficial wound to the inferior T-zone.  There is no active drainage or signs of infection on exam.  Remainder of the incisions to the left breast are intact and healing well.  Recommended to the patient that she apply Vaseline and Xeroform followed by a dressing daily to the right breast area of skin necrosis.  Discussed with her that this will slowly slough off and the wound will eventually declare itself.  Patient expressed understanding.  Will place  [facility]  order for supplies.  Recommended that she apply Vaseline to her left breast wounds and remainder of her incisions daily.  Discussed with her she may cover with a nonstick pad.  Patient expressed understanding.  Patient to follow back up next week.  Instructed her to call in the meantime if she has any questions or concerns about anything.  Pictures were obtained of the patient and placed in the chart with the patient's or guardian's permission.

## 2023-04-06 ENCOUNTER — Telehealth: Payer: Self-pay

## 2023-04-06 NOTE — Telephone Encounter (Signed)
 Received fax from [facility] : [facility]  will be contacting the patient with pricing options. Their deductible has not yet been satisfied. Thank you for your patience.

## 2023-04-07 ENCOUNTER — Encounter: Payer: Self-pay | Admitting: Plastic Surgery

## 2023-04-07 ENCOUNTER — Encounter: Payer: Self-pay | Admitting: Student

## 2023-04-12 NOTE — Progress Notes (Deleted)
Patient is a pleasant 43 year old female s/p bilateral breast reduction performed 03/22/2023 by Dr. Ladona Ridgel who presents to clinic for postoperative follow-up.  She was last seen here in clinic on 04/05/2023.  At that time, there was a 5 x 6 cm area of skin necrosis just medial to the vertical limb incision to the right breast.  There is also a bit of superficial dehiscence vertical limb and inferior T zone left breast.  Recommended Vaseline and Xeroform on the right breast in order to [facility] was placed.

## 2023-04-13 ENCOUNTER — Encounter: Payer: No Typology Code available for payment source | Admitting: Physician Assistant

## 2023-04-14 ENCOUNTER — Encounter: Payer: Self-pay | Admitting: Physician Assistant

## 2023-04-14 ENCOUNTER — Ambulatory Visit: Payer: No Typology Code available for payment source | Admitting: Physician Assistant

## 2023-04-14 VITALS — BP 154/98 | HR 110 | Ht 64.0 in | Wt 165.0 lb

## 2023-04-14 DIAGNOSIS — Z9889 Other specified postprocedural states: Secondary | ICD-10-CM

## 2023-04-14 NOTE — Progress Notes (Signed)
Patient is a pleasant 43 year old female s/p bilateral breast reduction performed 03/22/2023 by Dr. Ladona Ridgel who presents to clinic for postoperative follow-up.  Reviewed operative report and approximately 800 g was removed from each breast at time of surgery.  She was last seen here in clinic on 04/05/2023.  Unfortunately, there is a 5 x 6 cm area of skin necrosis involving the right breast along the vertical limb.  Recommended Vaseline and Xeroform dressing changes daily.  Today, patient states that she has been performing the dressing changes, as directed.  She states that there has been significant widening of the wound and copious drainage.  She denies any pain symptoms.  She also denies any fevers, leg swelling, chest discomfort, or difficulty breathing.  She is simply bothered by the malodor which she understands is expected with skin and tissue necrosis.  On exam, right breast has dehisced and wound measures 8 x 7 x 1 cm.  There is a small amount of residual slough that is debrided gently at bedside with sharp scissors.  NAC does appear viable.  Left breast with evidence slough measuring 8 cm along inframammary fold and 6 cm, approximately the entire length of the vertical limb.  NAC does appear viable.  Informed patient that she may experience similar wound dehiscence on the left breast given the large amount of incisional slough over both the inframammary and vertical limb.  She understands that these wounds may continue to worsen before starting to improve.  Unfortunately she is looking at multiple months of wound care.  Dr. Ladona Ridgel personally evaluated patient at bedside.  Recommending continued Xeroform dressing changes and weekly follow-up.  She can simply placed Xeroform followed by ABD pad and secure with bra.  Recommending similar dressings for each breast.  She can call the clinic should she have questions or concerns in interim.

## 2023-04-20 ENCOUNTER — Encounter: Payer: No Typology Code available for payment source | Admitting: Physician Assistant

## 2023-04-21 ENCOUNTER — Encounter: Payer: Self-pay | Admitting: Physician Assistant

## 2023-04-21 ENCOUNTER — Ambulatory Visit: Payer: No Typology Code available for payment source | Admitting: Physician Assistant

## 2023-04-21 VITALS — BP 156/97 | HR 111 | Ht 64.0 in | Wt 165.0 lb

## 2023-04-21 DIAGNOSIS — Z9889 Other specified postprocedural states: Secondary | ICD-10-CM

## 2023-04-21 NOTE — Progress Notes (Cosign Needed)
Patient is a pleasant 43 year old female s/p bilateral breast reduction performed 03/22/2023 by Dr. Ladona Ridgel complicated by wound dehiscence who presents to clinic for postoperative follow-up.   She was last seen here in clinic on 04/14/2023.  At that time, right breast dehiscence wound measured 8 x 7 x 1 cm.  Left breast NAC had incisional slough measuring 8 cm along inframammary fold and 6 cm up the vertical limb.  Informed patient that she may experience slight worsening of her left breast wounds, as well.  She understood that these wounds will likely require months of daily attention and dressing changes.  Recommending Xeroform daily followed by ABD pad and secured with bra.  Dr. Ladona Ridgel personally evaluated patient at bedside.  Today, patient is doing okay.  Unfortunately her left breast followed a similar path of dehiscence as the right side.  This was anticipated based on her exam last week.  She is applying Vaseline to the wounds followed by Xeroform and covering with ABD pad secured with bra.  She states that she has been overall doing okay with these dressing changes.  It will periodically bleed if she overexerts herself or if Xeroform dries.  She continues to deny any chest pain, difficulty breathing, leg swelling, fevers, or other concerns.  She reports that her wounds do not hurt, but are simply cumbersome.  She understands this is going to be a while before they are fully healed.  On exam, right breast wound measures 11 x 8 cm, approximately 1 to 1.5 cm depth.  Left breast wound measures 9.5 x 7.5 cm, similar depth.  The surrounding skin and tissue appears relatively healthy and without crepitus or obvious induration concerning for infection.  There is no significant malodor or drainage from the wounds.  Some granulation tissue noted at the base of each wound.  Will continue with current dressing changes.  Once there is no longer any residual slough, she may be a candidate for undermining the flaps  and closing them primarily in the operating room.  Will have Dr. Ladona Ridgel personally evaluate this patient at her appointment next week.  Continued healing via secondary intent in interim.  Picture(s) obtained of the patient and placed in the chart were with the patient's or guardian's permission.

## 2023-04-27 ENCOUNTER — Other Ambulatory Visit (HOSPITAL_COMMUNITY): Payer: Self-pay

## 2023-04-27 ENCOUNTER — Encounter: Payer: No Typology Code available for payment source | Admitting: Physician Assistant

## 2023-04-27 ENCOUNTER — Other Ambulatory Visit: Payer: No Typology Code available for payment source

## 2023-04-27 ENCOUNTER — Encounter: Payer: Self-pay | Admitting: Physician Assistant

## 2023-04-27 ENCOUNTER — Ambulatory Visit: Payer: No Typology Code available for payment source | Admitting: Physician Assistant

## 2023-04-27 VITALS — BP 168/102 | HR 108 | Ht 64.0 in | Wt 170.6 lb

## 2023-04-27 DIAGNOSIS — T8130XA Disruption of wound, unspecified, initial encounter: Secondary | ICD-10-CM

## 2023-04-27 DIAGNOSIS — Z9889 Other specified postprocedural states: Secondary | ICD-10-CM

## 2023-04-27 MED ORDER — DEXTROAMPHETAMINE SULFATE 10 MG PO TABS
20.0000 mg | ORAL_TABLET | Freq: Three times a day (TID) | ORAL | 0 refills | Status: DC
  Filled 2023-04-27: qty 180, 30d supply, fill #0

## 2023-04-27 NOTE — Progress Notes (Signed)
 Patient is a pleasant 43 year old female s/p bilateral breast reduction performed 03/22/2023 by Dr. Waddell complicated by wound dehiscence who presents to clinic for postoperative follow-up.   She was last seen here in clinic on 04/21/2023 at which time right breast wound measured 11 x 8 cm left breast wound measured 9.5 x 7.5 cm.  Recommended continued Xeroform dressing changes and follow-up in 1 week.  Dr. Waddell to personally evaluate patient for consideration of OR closure versus continued healing via secondary intent  Today, patient states that she is doing okay.  She has been applying Xeroform followed by ABD pads, as described.  She denies any chest pain, difficulty breathing, leg swelling, fevers, or other concerns.  Her wounds are not prickly bothersome.  She occasionally experiences bleeding at time of dressing changes.  She does report that her employer is no longer letting her return to work given her restrictions.  She is fine with this, but may acetate be left in 2 weeks.  On exam, wounds measure approximately 10.5 x 9 cm on the right, 10.5 x 8 cm on the left.  Improved wound healing.  Surrounding skin and tissue appears healthy.  No persistent erythema or induration.  No malodor.  No drainage.  Recommend continued daily dressing changes, twice daily if needed.  Continue to maintain in place with compressive garment.  Follow-up in 2 weeks, sooner if needed.  Patient understands to call the office and she have any questions or concerns.  Will likely be able to lift her work restrictions in 2 weeks if patient feels comfortable.  Dr. Waddell personally evaluated patient and agrees with assessment and plan.  He does not feel as though there will be any plans for imminent flap elevation and reclosure.  Instead, plan is for her to heal via secondary intent and consider scar revision down the road after she is fully healed.

## 2023-05-11 ENCOUNTER — Encounter: Payer: No Typology Code available for payment source | Admitting: Physician Assistant

## 2023-05-12 ENCOUNTER — Encounter: Payer: Self-pay | Admitting: Physician Assistant

## 2023-05-12 ENCOUNTER — Ambulatory Visit (INDEPENDENT_AMBULATORY_CARE_PROVIDER_SITE_OTHER): Payer: No Typology Code available for payment source | Admitting: Physician Assistant

## 2023-05-12 VITALS — BP 155/90 | HR 113

## 2023-05-12 DIAGNOSIS — Z9889 Other specified postprocedural states: Secondary | ICD-10-CM

## 2023-05-12 NOTE — Progress Notes (Signed)
Patient is a pleasant 43 year old female s/p bilateral breast reduction performed 03/22/2023 by Dr. Ladona Ridgel complicated by wound dehiscence who presents to clinic for postoperative follow-up.   She was last seen here in clinic on 04/27/2023.  At that time, wound measures 10.5 x 9 cm in the right, 10.5 x 8 cm on the left.  Surrounding skin and tissue appears healthy.  Recommending continued daily dressing changes.  Dr. Ladona Ridgel personally evaluated patient and does not feel as though there will be any plans for imminent flap elevation and reclosure.  Instead, plan for healing via secondary intent.  Today, patient is doing well.  She still reports complete numbness on the inferior aspects of her breast bilaterally.  However, she states the wounds have made some improvement since last encounter.  She continues with Xeroform followed by ABD pad secured with her compressive bra.  She plans to return to work today and requests a work note with no restrictions.  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  On exam, wound have made considerable progress since last encounter.  There is epithelialization over the majority of the right breast wound and advanced epithelialization from the skin edges on the left breast wound.  There are still pockets of granulation, but no residual slough.  Recommending continued dressing changes daily.  Discussed expectations regarding scarring.  She will likely benefit from 79-month follow-up to discuss scar revision with Dr. Ladona Ridgel.  In the interim, follow-up with me in 2 weeks to ensure continued wound healing.  She can call the office should she have any questions or concerns in interim.  Picture(s) obtained of the patient and placed in the chart were with the patient's or guardian's permission.

## 2023-05-18 ENCOUNTER — Other Ambulatory Visit: Payer: No Typology Code available for payment source

## 2023-05-24 ENCOUNTER — Encounter: Payer: Self-pay | Admitting: Registered Nurse

## 2023-05-24 ENCOUNTER — Telehealth: Payer: Self-pay | Admitting: Registered Nurse

## 2023-05-24 ENCOUNTER — Other Ambulatory Visit (HOSPITAL_COMMUNITY): Payer: Self-pay

## 2023-05-24 ENCOUNTER — Telehealth: Payer: Self-pay | Admitting: Physician Assistant

## 2023-05-24 DIAGNOSIS — U071 COVID-19: Secondary | ICD-10-CM

## 2023-05-24 DIAGNOSIS — R6889 Other general symptoms and signs: Secondary | ICD-10-CM

## 2023-05-24 MED ORDER — NIRMATRELVIR/RITONAVIR (PAXLOVID)TABLET
3.0000 | ORAL_TABLET | Freq: Two times a day (BID) | ORAL | 0 refills | Status: AC
  Filled 2023-05-24: qty 30, 5d supply, fill #0

## 2023-05-24 MED ORDER — COVID-19 ANTIGEN TEST VI KIT
1.0000 | PACK | Freq: Every day | 1 refills | Status: DC | PRN
  Filled 2023-05-24: qty 4, fill #0

## 2023-05-24 NOTE — Telephone Encounter (Signed)
 Patient reported home covid test positive and she would like antivirals.  Only taking tylenol and motrin no other prescription medications at this time. Denied close contacts at work in previous 48hours at work e.g. no mask greater than 15 minutes within 6 feet face to face contact.  Pt began quarantine at that time. Patient did not develop symptoms of  trouble breathing, chest pain, vomiting, diarrhea, sore throat, HA, body aches, or chills.   quarantine per CDC recommendations until symptoms improving and fever free/no vomiting/diarrhea in previous 24 hours. Day 1 of quarantine was 05/24/23. Patient to contact clinic staff if vomiting after coughing or unable to tolerate po fluids.  Discussed flu and other viral illnesses circulating in community and some causing GI upset.  If GI upset I have recommended clear fluids then bland diet.  Avoid dairy/spicy, fried and large portions of meat while having nausea.  If vomiting hold po intake x 1 hour.  Then sips clear fluids like broths, ginger ale, power ade, gatorade, pedialyte may advance to soft/bland if no vomiting x 24 hours and appetite returned otherwise hydration main focus. Call me at work from home number if symptoms not improved with plan of care  patient to call if high fever, dehydration, marked weakness, fainting, increased abdominal pain, blood in stool or vomit (red or black).     Reviewed possible Covid symptoms including cough, shortness of breath with exertion or at rest, runny nose, congestion, sinus pain/pressure, sore throat, fever/chills, body aches, fatigue, loss of taste/smell, GI symptoms of nausea/vomiting/diarrhea. Also reviewed same day/emergent eval/ER precautions of dizziness/syncope, confusion, blue tint to lips/face, severe shortness of breath/difficulty breathing/wheezing.     Patient to isolate in own room and if possible use only one bathroom if living with others in home.  Wear mask when out of room to help prevent spread to  others in household.  Sanitize high touch surfaces with lysol/chlorox/bleach spray or wipes daily as viruses are known to live on surfaces from 24 hours to days.  Patient does want antivirals.  Electronic Rx sent to her pharmacy of choice for nirmatrelvir/ritonavir 150mg /100mg  take 2/1 po BID x 5 days #20/10 RF0  Patient at higher risk for hospitalization due to recent surgery, ADD.  Normal GFR in the past year.  Patient is  up to date on covid vaccines.  Patient is not taking any prescription medications at this time Only taking OTC cough/cold/fever medication at this time dayquil/nyquil/honey.  Patient paxlovid emergency use handout sent to patient electronically along with covid quarantine exitcare handout in my chart.  Discussed how to take paxlovid e.g. 3 pills 2 of nirmatrelvir 150mg  and 1 of ritonavir 100mg  am/pm x 5 days  Discussed lemonade can sometimes help with metallic/plastic taste in mouth (side effect medication).  Discussed most common side effects GI upset and bad taste in mouth.  Use birth control/avoid getting pregnant while on paxlovid and no breastfeeding.  Discussed I recommended not having sex with anyone while sick/testing positive/10 day quarantine as could spread virus to partner.  Discussed with patient I would call again tomorrow to follow up symptoms/see if questions/concerns.  Exitcare handouts on covid quarantine/home care sent to patient my chart.  FDA handout on paxlovid sent to patient.  Patient may use saline nose sprays each nostril q2h prn congestion/sore throat.  Research has shown it helps to prevent hospitalizations and decrease discomfort.   May use flonase nasal 1 spray each nostril BID prn rhinitis.   honey 1 tablespoon every  4 hours is a natural cough suppressant but caution due to his diabetes.  Avoid dehydration and drink water to keep urine pale yellow clear and voiding every 2-4 hours while awake.  Patient alert and oriented x3, spoke full sentences without  difficulty.  No nasal congestion/cough/throat clearing/hoarse voice/wheezing/shortness of breath during 6 minute telephone call.  Discussed with patient can contact NP Inetta Fermo through my chart/[phone] when clinic closed if questions or concerns until RN Olegario Messier returns to clinic on Monday-Thursday 8a-5p [phone].   Pt verbalized understanding and agreement with plan of care. No further questions/concerns at this time. Pt reminded to contact clinic with any changes in symptoms or questions/concerns. HR and supervisor notified patient excused absence 3/4-05/25/2023 re-evaluation 05/25/2023  strict mask wear through Day 10 Sunday 05/29/2023 and no eating in employee lunch room.

## 2023-05-24 NOTE — Telephone Encounter (Signed)
 Pt called, has the flu canceled her apt this week, she will call and r/s after she is symptoms free.

## 2023-05-24 NOTE — Telephone Encounter (Signed)
 Spoke with patient has had fever 102F since Friday.  Has not performed home covid test.  Electronic Rx sent to her pharmacy of choice #4 RF1 UUD discussed with her insurance can get 4 covid tests per month under insurance.  Patient A&Ox3 spoke full sentences without difficulty.  No audible cough/throat clearing noted mild nasal congestion.  Stated hasn't left her house all weekend and missed work yesterday also.  Fatigued/body aches and nausea.  She has ondansetron at home for prn use po.  Discussed she can use 1 tab every 8 hours as needed for n/v  Patient stated she would go to pharmacy and get covid test and perform and notify me with results.  Discussed if negative I will send in rx for tamiflu 75mg  po BID x 5 days #10 RF0 as flu circulating in high numbers in community but I cannot tell over the phone if she has covid or flu and to complete a home covid test.  Patient agreed with plan of care and had no further questions at this time.

## 2023-05-25 ENCOUNTER — Other Ambulatory Visit (HOSPITAL_COMMUNITY): Payer: Self-pay

## 2023-05-25 NOTE — Telephone Encounter (Signed)
 Spoke with patient via telephone stated vomiting/nausea at 1700 today.  Had taken paxlovid this am without difficulty.  Has noticed metallic taste in mouth.  Stated did not have fever today otherwise feeling the same denied worsening of existing symptoms.  Her mother checking on her.  Discussed excused absence work extended and re-evaluation tomorrow via telephone in the afternoon.  Discussed may take her ondansetron ODT 4mg  po TID prn left over from surgery breast reduction.   I have recommended hold all po intake x 1 hours then clear fluids and bland diet.  Avoid dairy/spicy, fried and large portions of meat while having nausea.  If vomiting hold po intake x 1 hour.  Then sips clear fluids like broths, ginger ale, power ade, gatorade, pedialyte may advance to soft/bland if no vomiting x 24 hours and appetite returned otherwise hydration main focus.  I have alerted the patient to call if high fever, dehydration, marked weakness, fainting, increased abdominal pain, blood in stool or vomit (red or black).   Exitcare handout on nausea/vomiting.Patient stated she has been drinking water all day to remain hydrated.  A&Ox3 spoke full sentences without difficulty no cough/congestion/throat clearing audible during 4 minute telephone call.  Supervisor and HR notified excused absence extended 24 hours.  Continue quarantine at home.  Patient verbalized agreement and understanding of treatment plan and had no further questions at this time.

## 2023-05-26 ENCOUNTER — Other Ambulatory Visit (HOSPITAL_COMMUNITY): Payer: Self-pay

## 2023-05-26 ENCOUNTER — Encounter: Payer: No Typology Code available for payment source | Admitting: Physician Assistant

## 2023-05-26 MED ORDER — DEXTROAMPHETAMINE SULFATE 10 MG PO TABS
20.0000 mg | ORAL_TABLET | Freq: Three times a day (TID) | ORAL | 0 refills | Status: DC
  Filled 2023-05-27: qty 180, 30d supply, fill #0

## 2023-05-27 ENCOUNTER — Other Ambulatory Visit (HOSPITAL_COMMUNITY): Payer: Self-pay

## 2023-06-01 ENCOUNTER — Ambulatory Visit (INDEPENDENT_AMBULATORY_CARE_PROVIDER_SITE_OTHER): Admitting: Physician Assistant

## 2023-06-01 VITALS — BP 140/90 | HR 94

## 2023-06-01 DIAGNOSIS — Z9889 Other specified postprocedural states: Secondary | ICD-10-CM

## 2023-06-01 NOTE — Progress Notes (Signed)
 Patient is a pleasant 43 year old female s/p bilateral breast reduction performed 03/22/2023 by Dr. Ladona Ridgel complicated by wound dehiscence who presents to clinic for postoperative follow-up.   She was last seen here in clinic on 05/12/2023.  At that time, reported complete loss of sensation to inferior aspects of breast bilaterally.  She does report however that there had been some improvement since previous encounter with Xeroform followed by ABD pads.  On exam, there had been some considerable improvement and advancing epithelialization.  No residual slough noted.  Discussed expectations regarding scarring with patient and she would benefit from 71-month follow-up with Dr. Ladona Ridgel to discuss possible revision.  Today, she is doing well from a postoperative standpoint.  She states that she has skin coverage throughout both of her breasts and no longer has persistent wounds requiring dressings.  She is overall quite pleased with the new size and shape, but is disappointed with the amount of scarring.  She does state a confidence issue resulting from the scarring.  She has had significant relief in her upper back/neck discomfort related to macromastia.  Overall states that she is pleased with results of surgery despite her postoperative dehiscence.  On exam, breasts have relatively good shape and symmetry.  NAC's are healthy and viable.  Her large wounds have epithelialized throughout.  No drainage or persistent defects.  Breasts are soft.    She can attempt silicone scar management, but it likely will not be as beneficial as she hopes.  Recommending follow-up with Dr. Ladona Ridgel 6 months from time of surgery to discuss possible scar revision.  She is not sure yet if she would like to go back to surgery, but does admit that she is bothered by the scarring and cites confidence issues.  Emphasized the importance of at least knowing her options, even if she chooses not proceed with surgery.  There is no urgency to  revision.    Patient to call the office should she have any questions or concerns in the interim.  Picture(s) obtained of the patient and placed in the chart were with the patient's or guardian's permission.

## 2023-06-23 ENCOUNTER — Other Ambulatory Visit: Payer: Self-pay

## 2023-06-23 ENCOUNTER — Other Ambulatory Visit: Payer: Self-pay | Admitting: Registered Nurse

## 2023-06-23 VITALS — BP 130/93 | HR 91 | Ht 64.5 in | Wt 174.0 lb

## 2023-06-23 DIAGNOSIS — Z0000 Encounter for general adult medical examination without abnormal findings: Secondary | ICD-10-CM

## 2023-06-23 NOTE — Progress Notes (Signed)
 Exec panel and hgbA1C

## 2023-06-25 LAB — CMP12+LP+TP+TSH+6AC+CBC/D/PLT
ALT: 29 IU/L (ref 0–32)
AST: 33 IU/L (ref 0–40)
Albumin: 4.2 g/dL (ref 3.9–4.9)
Alkaline Phosphatase: 114 IU/L (ref 44–121)
BUN/Creatinine Ratio: 15 (ref 9–23)
BUN: 13 mg/dL (ref 6–24)
Basophils Absolute: 0.1 10*3/uL (ref 0.0–0.2)
Basos: 1 %
Bilirubin Total: <0.2 mg/dL (ref 0.0–1.2)
Calcium: 9.1 mg/dL (ref 8.7–10.2)
Chloride: 101 mmol/L (ref 96–106)
Chol/HDL Ratio: 4.9 ratio — ABNORMAL HIGH (ref 0.0–4.4)
Cholesterol, Total: 291 mg/dL — ABNORMAL HIGH (ref 100–199)
Creatinine, Ser: 0.86 mg/dL (ref 0.57–1.00)
EOS (ABSOLUTE): 0.1 10*3/uL (ref 0.0–0.4)
Eos: 2 %
Estimated CHD Risk: 1.3 times avg. — ABNORMAL HIGH (ref 0.0–1.0)
Free Thyroxine Index: 2.6 (ref 1.2–4.9)
GGT: 33 IU/L (ref 0–60)
Globulin, Total: 3.1 g/dL (ref 1.5–4.5)
Glucose: 115 mg/dL — ABNORMAL HIGH (ref 70–99)
HDL: 60 mg/dL (ref >39)
Hematocrit: 40.5 % (ref 34.0–46.6)
Hemoglobin: 12.3 g/dL (ref 11.1–15.9)
Immature Grans (Abs): 0 10*3/uL (ref 0.0–0.1)
Immature Granulocytes: 0 %
Iron: 49 ug/dL (ref 27–159)
LDH: 278 IU/L — ABNORMAL HIGH (ref 119–226)
LDL Chol Calc (NIH): 203 mg/dL — ABNORMAL HIGH (ref 0–99)
Lymphocytes Absolute: 2.1 10*3/uL (ref 0.7–3.1)
Lymphs: 26 %
MCH: 27.7 pg (ref 26.6–33.0)
MCHC: 30.4 g/dL — ABNORMAL LOW (ref 31.5–35.7)
MCV: 91 fL (ref 79–97)
Monocytes Absolute: 0.5 10*3/uL (ref 0.1–0.9)
Monocytes: 6 %
Neutrophils Absolute: 5.3 10*3/uL (ref 1.4–7.0)
Neutrophils: 65 %
Phosphorus: 3.7 mg/dL (ref 3.0–4.3)
Platelets: 359 10*3/uL (ref 150–450)
Potassium: 5.2 mmol/L (ref 3.5–5.2)
RBC: 4.44 x10E6/uL (ref 3.77–5.28)
RDW: 14.2 % (ref 11.7–15.4)
Sodium: 135 mmol/L (ref 134–144)
T3 Uptake Ratio: 43 % — ABNORMAL HIGH (ref 24–39)
T4, Total: 6.1 ug/dL (ref 4.5–12.0)
TSH: 2.55 u[IU]/mL (ref 0.450–4.500)
Total Protein: 7.3 g/dL (ref 6.0–8.5)
Triglycerides: 154 mg/dL — ABNORMAL HIGH (ref 0–149)
Uric Acid: 4 mg/dL (ref 2.6–6.2)
VLDL Cholesterol Cal: 28 mg/dL (ref 5–40)
WBC: 8.1 10*3/uL (ref 3.4–10.8)
eGFR: 86 mL/min/{1.73_m2} (ref >59)

## 2023-06-25 LAB — HEMOGLOBIN A1C
Est. average glucose Bld gHb Est-mCnc: 126 mg/dL
Hgb A1c MFr Bld: 6 % — ABNORMAL HIGH (ref 4.8–5.6)

## 2023-06-27 ENCOUNTER — Other Ambulatory Visit (HOSPITAL_COMMUNITY): Payer: Self-pay

## 2023-06-27 MED ORDER — DEXTROAMPHETAMINE SULFATE 10 MG PO TABS
20.0000 mg | ORAL_TABLET | Freq: Three times a day (TID) | ORAL | 0 refills | Status: DC
  Filled 2023-06-27: qty 180, 30d supply, fill #0

## 2023-07-03 NOTE — Telephone Encounter (Signed)
 Patient seen in workcenter stated feeling well denied concerns A&Ox3 spoke full sentences without difficulty skin warm dry and pink respirations even and unlabored RA 07/01/2023 gait sure and steady

## 2023-07-12 ENCOUNTER — Encounter: Payer: Self-pay | Admitting: Registered Nurse

## 2023-07-12 ENCOUNTER — Telehealth: Payer: Self-pay | Admitting: Registered Nurse

## 2023-07-12 DIAGNOSIS — I10 Essential (primary) hypertension: Secondary | ICD-10-CM

## 2023-07-12 MED ORDER — HYDROCHLOROTHIAZIDE 12.5 MG PO TABS
12.5000 mg | ORAL_TABLET | Freq: Every day | ORAL | Status: DC
Start: 2023-07-12 — End: 2023-10-24

## 2023-07-12 NOTE — Telephone Encounter (Signed)
 Patient had BP recheck with RN [facility]  yesterday 07/11/2023 130/93 weight 174lbs.    I recommended DASH diet and 5-10lb weight loss over the next year also.  LDL 203 and Hgba1c 6  LDL over 130 and BP greater than 135/85 met 1/3 Be Well requirements.  Spoke with patient via telephone regarding Be Well alternatives.  Stated my blood pressure has been high at every appt.  She does not want to stop vaping even though using non-nicotine  formula it helps her to not smoke cigarettes discussed even though states non nicotine  may have ingredients that raises blood pressure.  Recommended weight loss 5-10 lbs and see PCM for elevated LDL .  Discussed increased LDL 100 points over the past year from 2025 Be Well results.  She stated had stopped protein drinks will restart.  Discussed high fiber foods oatmeal, cheerios, whole grain bread, brown rice, fruits and vegetables.  Patient stated she does not see herself changing diet would like to start blood pressure medication at this time.  Discussed 3 available in clinic hydrochlorothiazide  take in am will make her urinate more during the day; amlodipine dilates blood vessels to lower blood pressure but sometimes ankle swelling noted and lisinopril some people develop a cough with use.  Patient preferred hydrochlorothiazide  12.5mg  po daily initiated today dispensed 90 tabs from [facility] to patient.  See RN Thersia Flax next week for BP recheck.  Patient agreed with plan of care and had no further questions at this time.  Verbalized understanding information/instructions A&Ox3.

## 2023-07-25 ENCOUNTER — Other Ambulatory Visit (HOSPITAL_COMMUNITY): Payer: Self-pay

## 2023-07-25 MED ORDER — DEXTROAMPHETAMINE SULFATE 10 MG PO TABS
20.0000 mg | ORAL_TABLET | Freq: Three times a day (TID) | ORAL | 0 refills | Status: DC
  Filled 2023-07-27: qty 180, 30d supply, fill #0

## 2023-07-27 ENCOUNTER — Other Ambulatory Visit (HOSPITAL_COMMUNITY): Payer: Self-pay

## 2023-08-16 ENCOUNTER — Encounter: Payer: Self-pay | Admitting: Family

## 2023-08-16 ENCOUNTER — Other Ambulatory Visit: Payer: Self-pay | Admitting: Family

## 2023-08-16 DIAGNOSIS — E049 Nontoxic goiter, unspecified: Secondary | ICD-10-CM

## 2023-08-16 DIAGNOSIS — E041 Nontoxic single thyroid nodule: Secondary | ICD-10-CM

## 2023-08-25 ENCOUNTER — Other Ambulatory Visit (HOSPITAL_COMMUNITY): Payer: Self-pay

## 2023-08-25 MED ORDER — DEXTROAMPHETAMINE SULFATE 10 MG PO TABS
20.0000 mg | ORAL_TABLET | Freq: Three times a day (TID) | ORAL | 0 refills | Status: DC
  Filled 2023-08-26: qty 180, 30d supply, fill #0

## 2023-08-26 ENCOUNTER — Other Ambulatory Visit (HOSPITAL_COMMUNITY): Payer: Self-pay

## 2023-08-30 NOTE — Progress Notes (Signed)
 Referring Provider Felicita Horns, FNP [address] [place],  [place] [address]   CC:  Chief Complaint  Patient presents with   Post-op Follow-up      Kimberly Rowland is an 43 y.o. female.  HPI: Patient is a pleasant 43 year old female s/p bilateral breast reduction performed 03/22/2023 by Dr. Carolynne Citron complicated by wound dehiscence who presents to clinic for postoperative follow-up.  She was last seen here in clinic on 06/01/2023.  At that time, her wounds had epithelialized throughout, but with considerable scarring.  She did cite confidence issues resulting from her complications from reduction surgery.  Discussed silicone scar management, but given the degree of scarring, recommended surgical consult with Dr. Carolynne Citron for scar revision.  Today, patient is doing okay from a postoperative standpoint.  She tells me that she is very pleased with her results when looking down and she is happy with the size and shape.  However, she admits that she is still quite self-conscious about the scarring on the inferior portions of her breasts bilaterally.  She has been applying vitamin E regularly without any considerable improvement or change since epithelializing a few months ago.  She is not sure she wants to move forward with a surgical scar revision however given the complicated postoperative course after her reduction.   Allergies  Allergen Reactions   Augmentin [Amoxicillin-Pot Clavulanate] Itching    Hives     Outpatient Encounter Medications as of 08/31/2023  Medication Sig   Cholecalciferol  (VITAMIN D3) 50 MCG (2000 UT) TABS Take 1 tablet (2,000 Units total) by mouth daily with a meal   COVID-19 Antigen Test KIT Place 1 Device into the nose daily as needed.   dextroamphetamine  (DEXTROSTAT ) 10 MG tablet Take 2 tablets (20 mg total) by mouth 3 (three) times daily.   dextroamphetamine  (DEXTROSTAT ) 10 MG tablet Take 2 tablets (20 mg total) by mouth 3 (three) times daily.    dextroamphetamine  (DEXTROSTAT ) 10 MG tablet Take 2 tablets (20 mg total) by mouth 3 (three) times daily.   dextroamphetamine  (DEXTROSTAT ) 10 MG tablet Take 2 tablets (20 mg total) by mouth 3 (three) times daily.   dextroamphetamine  (DEXTROSTAT ) 10 MG tablet Take 2 tablets (20 mg total) by mouth 3 (three) times daily.   dextroamphetamine  (DEXTROSTAT ) 10 MG tablet Take 2 tablets (20 mg total) by mouth 3 (three) times daily.   hydrochlorothiazide  (HYDRODIURIL ) 12.5 MG tablet Take 1 tablet (12.5 mg total) by mouth daily.   No facility-administered encounter medications on file as of 08/31/2023.     Past Medical History:  Diagnosis Date   ADD (attention deficit disorder)     Past Surgical History:  Procedure Laterality Date   BREAST REDUCTION SURGERY Bilateral 03/22/2023   Procedure: MAMMARY REDUCTION  (BREAST);  Surgeon: Teretha Ferguson, MD;  Location: [facility];  Service: Plastics;  Laterality: Bilateral;   NO PAST SURGERIES      Family History  Problem Relation Age of Onset   Healthy Mother    Healthy Father    Breast cancer Maternal Grandmother     Social History   Social History Narrative   43 y/o boy, william (2024)      Review of Systems General: Denies fevers or chills Cardio: Denies chest pain Pulmonary: Denies difficulty breathing  Physical Exam    08/31/2023    9:50 AM 06/23/2023    8:04 AM 06/01/2023   11:18 AM  Vitals with BMI  Height 5' 4 5' 4.5  Weight 171 lbs 6 oz 174 lbs   BMI 29.41 29.42   Systolic 157 130 191  Diastolic 105 93 90  Pulse 115 91 94    General:  No acute distress, nontoxic appearing  Respiratory: No increased work of breathing Neuro: Alert and oriented Psychiatric: Normal mood and affect  Breasts: Excellent shape and symmetry.  Soft throughout.  NAC's remain healthy.  Right breast has 8 x 4 cm vertical limb thickened scar.  Additional inframammary scarring out laterally.  Left breast has 4 x 1 cm vertical limb scar  with 9 x 2 cm inframammary scar.  Assessment/Plan  Bilateral breast reduction complicated by bilateral dehiscence with subsequent scarring: - Emphasized the importance of regular mechanical massage on the scars. - Dr. Carolynne Citron also personally evaluated the patient.  He agrees that her best options for scar revision would be surgical rather than with any topical agents.  - She is not eager to proceed at this time, but plan for follow-up in approximately 6 months for reevaluation.  She does state that she is certainly interested in moving forward, but will allow for further scar healing and softening prior to making decision.  Picture(s) obtained of the patient and placed in the chart were with the patient's or guardian's permission.   Kimberly Shope PA-C 08/31/2023, 10:16 AM

## 2023-08-31 ENCOUNTER — Ambulatory Visit (INDEPENDENT_AMBULATORY_CARE_PROVIDER_SITE_OTHER): Admitting: Physician Assistant

## 2023-08-31 ENCOUNTER — Encounter: Payer: Self-pay | Admitting: Physician Assistant

## 2023-08-31 VITALS — BP 133/93 | HR 90 | Ht 64.0 in | Wt 171.4 lb

## 2023-08-31 DIAGNOSIS — Z9889 Other specified postprocedural states: Secondary | ICD-10-CM

## 2023-08-31 DIAGNOSIS — T8130XA Disruption of wound, unspecified, initial encounter: Secondary | ICD-10-CM

## 2023-08-31 DIAGNOSIS — L905 Scar conditions and fibrosis of skin: Secondary | ICD-10-CM

## 2023-09-05 ENCOUNTER — Other Ambulatory Visit (HOSPITAL_COMMUNITY): Payer: Self-pay

## 2023-09-05 ENCOUNTER — Other Ambulatory Visit (HOSPITAL_COMMUNITY)
Admission: RE | Admit: 2023-09-05 | Discharge: 2023-09-05 | Disposition: A | Discharge status: Home / Self Care | Source: Ambulatory Visit | Attending: Family | Admitting: Family

## 2023-09-05 ENCOUNTER — Ambulatory Visit (INDEPENDENT_AMBULATORY_CARE_PROVIDER_SITE_OTHER): Payer: No Typology Code available for payment source | Admitting: Family

## 2023-09-05 ENCOUNTER — Encounter: Payer: Self-pay | Admitting: Family

## 2023-09-05 VITALS — BP 146/94 | HR 78 | Temp 98.0°F | Ht 65.0 in | Wt 173.2 lb

## 2023-09-05 DIAGNOSIS — R7989 Other specified abnormal findings of blood chemistry: Secondary | ICD-10-CM

## 2023-09-05 DIAGNOSIS — E559 Vitamin D deficiency, unspecified: Secondary | ICD-10-CM

## 2023-09-05 DIAGNOSIS — Z1272 Encounter for screening for malignant neoplasm of vagina: Secondary | ICD-10-CM | POA: Insufficient documentation

## 2023-09-05 DIAGNOSIS — R7402 Elevation of levels of lactic acid dehydrogenase (LDH): Secondary | ICD-10-CM | POA: Diagnosis not present

## 2023-09-05 DIAGNOSIS — E782 Mixed hyperlipidemia: Secondary | ICD-10-CM | POA: Insufficient documentation

## 2023-09-05 DIAGNOSIS — R7303 Prediabetes: Secondary | ICD-10-CM

## 2023-09-05 DIAGNOSIS — Z01419 Encounter for gynecological examination (general) (routine) without abnormal findings: Secondary | ICD-10-CM | POA: Insufficient documentation

## 2023-09-05 DIAGNOSIS — I10 Essential (primary) hypertension: Secondary | ICD-10-CM | POA: Insufficient documentation

## 2023-09-05 DIAGNOSIS — D500 Iron deficiency anemia secondary to blood loss (chronic): Secondary | ICD-10-CM

## 2023-09-05 MED ORDER — LOSARTAN POTASSIUM 50 MG PO TABS
50.0000 mg | ORAL_TABLET | Freq: Every day | ORAL | 3 refills | Status: DC
  Filled 2023-09-05: qty 90, 90d supply, fill #0
  Filled 2024-02-22: qty 90, 90d supply, fill #1

## 2023-09-05 NOTE — Progress Notes (Signed)
 Subjective:  Patient ID: Kimberly Rowland, female    DOB: 1980-06-24  Age: 43 y.o. MRN: [id number]  Patient Care Team: Felicita Horns, FNP as PCP - General (Family Medicine)   CC:  Chief Complaint  Patient presents with   Annual Exam    HPI Kimberly Rowland is a 43 y.o. female who presents today for a well woman exam. She reports consuming a general diet. The patient has a physically strenuous job, but has no regular exercise apart from work.  She generally feels well. She reports sleeping well. She does not have additional problems to discuss today.   Vision:Within last year Dental:Receives regular dental care  Mammogram: 04/07/22, had a breast reduction in December 2024 she is waiting their clearance to get a mammogram. Last pap: 'years ago'  Does not want to be tested for STDS   Pt is without acute concerns.   Elevated LDH on occupational health lab work, denies night sweats, no fevers.  No swollen lymph nodes.   Advanced Directives Patient does not have advanced directives  She did just get lab work at work doesn't have copy with her but will get us  a copy.   HTN: did try hydrochlorothiazide  but it made her feel lightheaded.   DEPRESSION SCREENING    09/05/2023    8:01 AM 09/02/2022    8:10 AM  PHQ 2/9 Scores  PHQ - 2 Score 0 1  PHQ- 9 Score 0 3     ROS: Negative unless specifically indicated above in HPI.    Current Outpatient Medications:    Cholecalciferol  (VITAMIN D3) 50 MCG (2000 UT) TABS, Take 1 tablet (2,000 Units total) by mouth daily with a meal, Disp: 100 tablet, Rfl: 3   dextroamphetamine  (DEXTROSTAT ) 10 MG tablet, Take 2 tablets (20 mg total) by mouth 3 (three) times daily., Disp: 180 tablet, Rfl: 0   dextroamphetamine  (DEXTROSTAT ) 10 MG tablet, Take 2 tablets (20 mg total) by mouth 3 (three) times daily., Disp: 180 tablet, Rfl: 0   dextroamphetamine  (DEXTROSTAT ) 10 MG tablet, Take 2 tablets (20 mg total) by mouth 3 (three) times daily., Disp: 180  tablet, Rfl: 0   dextroamphetamine  (DEXTROSTAT ) 10 MG tablet, Take 2 tablets (20 mg total) by mouth 3 (three) times daily., Disp: 180 tablet, Rfl: 0   dextroamphetamine  (DEXTROSTAT ) 10 MG tablet, Take 2 tablets (20 mg total) by mouth 3 (three) times daily., Disp: 180 tablet, Rfl: 0   dextroamphetamine  (DEXTROSTAT ) 10 MG tablet, Take 2 tablets (20 mg total) by mouth 3 (three) times daily., Disp: 180 tablet, Rfl: 0   hydrochlorothiazide  (HYDRODIURIL ) 12.5 MG tablet, Take 1 tablet (12.5 mg total) by mouth daily., Disp: , Rfl:    losartan (COZAAR) 50 MG tablet, Take 1 tablet (50 mg total) by mouth daily., Disp: 90 tablet, Rfl: 3    Objective:    BP (!) 146/94 (BP Location: Left Arm, Patient Position: Sitting, Cuff Size: Normal)   Pulse 78   Temp 98 F (36.7 C) (Temporal)   Ht 5' 5 (1.651 m)   Wt 173 lb 3.2 oz (78.6 kg)   SpO2 98%   BMI 28.82 kg/m   BP Readings from Last 3 Encounters:  09/05/23 (!) 146/94  08/31/23 (!) 133/93  06/23/23 (!) 130/93      @PHYSEXAMBYAGE @  Gen: NAD, resting comfortably Breasts: breasts appear normal, no suspicious masses, no skin or nipple changes or axillary nodes Physical Exam Genitourinary:    General: Normal vulva.     Pubic Area: No  rash.      Labia:        Right: No rash, tenderness, lesion or injury.        Left: No rash, tenderness, lesion or injury.      Urethra: No prolapse or urethral pain.     Vagina: Normal. No vaginal discharge, tenderness or lesions.     Cervix: No discharge.     Rectum: Normal.  Psych: Normal affect and thought content      Assessment & Plan:  Elevated LDH Assessment & Plan: Incidental testing with elevation during her occupational health draw  Will repeat today  Orders: -     Lactate dehydrogenase  Abnormal serum thyroxine (T4) level -     T3, free -     T4, free -     TSH  Prediabetes  Vitamin D  deficiency Assessment & Plan: Ordered vitamin d  pending results.     Iron deficiency anemia due  to chronic blood loss  Mixed hyperlipidemia Assessment & Plan: Ordered lipid panel, pending results. Work on low cholesterol diet and exercise as tolerated Lpa ordered  Orders: -     Lipoprotein A (LPA) -     Lipid panel  Primary hypertension Assessment & Plan: Start losartan 50 mg once daily  Pt advised of the following:  Continue medication as prescribed. Monitor blood pressure periodically and/or when you feel symptomatic. Goal is <130/90 on average. Ensure that you have rested for 30 minutes prior to checking your blood pressure. Record your readings and bring them to your next visit if necessary.work on a low sodium diet. Urine m/a ordered  Orders: -     Losartan Potassium; Take 1 tablet (50 mg total) by mouth daily.  Dispense: 90 tablet; Refill: 3 -     Microalbumin / creatinine urine ratio; Future  Encounter for Papanicolaou smear of vagina as part of routine gynecological examination Assessment & Plan: Pt verbalized consent for pelvic exam Declines std testing hpv thin prep ordered and pending results Pap exam in office completed, pt tolerated well.   Patient Counseling(The following topics were reviewed):  Preventative care handout given to pt  Health maintenance and immunizations reviewed. Please refer to Health maintenance section. Pt advised on safe sex, wearing seatbelts in car, and proper nutrition labwork ordered today for annual Dental health: Discussed importance of regular tooth brushing, flossing, and dental visits.   Orders: -     Cytology - PAP      Follow-up: Return in about 6 weeks (around 10/17/2023) for f/u blood pressure.   Felicita Horns, FNP

## 2023-09-05 NOTE — Assessment & Plan Note (Signed)
 Pt advised of the following: Work on a diabetic diet, try to incorporate exercise at least 20-30 a day for 3 days a week or more.

## 2023-09-05 NOTE — Assessment & Plan Note (Signed)
 Pt verbalized consent for pelvic exam Declines std testing hpv thin prep ordered and pending results Pap exam in office completed, pt tolerated well.   Patient Counseling(The following topics were reviewed):  Preventative care handout given to pt  Health maintenance and immunizations reviewed. Please refer to Health maintenance section. Pt advised on safe sex, wearing seatbelts in car, and proper nutrition labwork ordered today for annual Dental health: Discussed importance of regular tooth brushing, flossing, and dental visits.

## 2023-09-05 NOTE — Assessment & Plan Note (Signed)
 Incidental testing with elevation during her occupational health draw  Will repeat today

## 2023-09-05 NOTE — Assessment & Plan Note (Signed)
 Ordered lipid panel, pending results. Work on low cholesterol diet and exercise as tolerated Lpa ordered

## 2023-09-05 NOTE — Assessment & Plan Note (Signed)
 Ordered vitamin d pending results.

## 2023-09-05 NOTE — Assessment & Plan Note (Signed)
 Start losartan 50 mg once daily  Pt advised of the following:  Continue medication as prescribed. Monitor blood pressure periodically and/or when you feel symptomatic. Goal is <130/90 on average. Ensure that you have rested for 30 minutes prior to checking your blood pressure. Record your readings and bring them to your next visit if necessary.work on a low sodium diet. Urine m/a ordered

## 2023-09-06 ENCOUNTER — Ambulatory Visit: Payer: Self-pay | Admitting: Family

## 2023-09-06 LAB — TSH: TSH: 1.55 m[IU]/L

## 2023-09-06 LAB — LIPID PANEL
Cholesterol: 178 mg/dL (ref <200)
HDL: 49 mg/dL — ABNORMAL LOW (ref >=50)
LDL Cholesterol (Calc): 102 mg/dL — ABNORMAL HIGH
Non-HDL Cholesterol (Calc): 129 mg/dL (ref <130)
Total CHOL/HDL Ratio: 3.6 (calc) (ref <5.0)
Triglycerides: 173 mg/dL — ABNORMAL HIGH (ref <150)

## 2023-09-06 LAB — T3, FREE: T3, Free: 2.8 pg/mL (ref 2.3–4.2)

## 2023-09-06 LAB — MICROALBUMIN / CREATININE URINE RATIO
Creatinine, Urine: 182 mg/dL (ref 20–275)
Microalb Creat Ratio: 6 mg/g{creat} (ref <30)
Microalb, Ur: 1.1 mg/dL

## 2023-09-06 LAB — T4, FREE: Free T4: 1.1 ng/dL (ref 0.8–1.8)

## 2023-09-06 LAB — LIPOPROTEIN A (LPA): Lipoprotein (a): 23 nmol/L (ref <75)

## 2023-09-06 LAB — LACTATE DEHYDROGENASE: LDH: 165 U/L (ref 100–200)

## 2023-09-07 LAB — CYTOLOGY - PAP
Comment: NEGATIVE
Diagnosis: NEGATIVE
Diagnosis: REACTIVE
High risk HPV: NEGATIVE

## 2023-09-07 NOTE — Telephone Encounter (Signed)
 PCM visit scheduled 09/07/23 with PCM.  Note patient has thyroid  nodule which may be impacting cholesterol/BP  PCM visit will qualify as alternative for BP will annotate paperwork once completed.  Patient still needs to complete smoke/vape quiz/video for insurance discount per alternative requirements prior to 20 Oct 2023.

## 2023-09-12 ENCOUNTER — Ambulatory Visit: Payer: Self-pay | Admitting: Registered Nurse

## 2023-09-22 ENCOUNTER — Other Ambulatory Visit (HOSPITAL_COMMUNITY): Payer: Self-pay

## 2023-09-22 MED ORDER — DEXTROAMPHETAMINE SULFATE 10 MG PO TABS
20.0000 mg | ORAL_TABLET | Freq: Three times a day (TID) | ORAL | 0 refills | Status: AC
  Filled 2023-09-26: qty 180, 30d supply, fill #0

## 2023-09-26 ENCOUNTER — Other Ambulatory Visit (HOSPITAL_COMMUNITY): Payer: Self-pay

## 2023-09-29 NOTE — Telephone Encounter (Signed)
 Patient completed tobacco cessation quiz; [employer] form completed and given to HR Jen for insurance discount starting 21 Dec 2023

## 2023-09-29 NOTE — Telephone Encounter (Signed)
 Patient reported started new blood pressure medication from [facility] has been feeling well and denied further concerns

## 2023-10-18 ENCOUNTER — Other Ambulatory Visit (HOSPITAL_COMMUNITY): Payer: Self-pay

## 2023-10-24 ENCOUNTER — Ambulatory Visit (INDEPENDENT_AMBULATORY_CARE_PROVIDER_SITE_OTHER): Admitting: Family

## 2023-10-24 ENCOUNTER — Other Ambulatory Visit (HOSPITAL_COMMUNITY): Payer: Self-pay

## 2023-10-24 ENCOUNTER — Encounter: Payer: Self-pay | Admitting: Family

## 2023-10-24 VITALS — BP 112/70 | HR 72 | Temp 98.4°F | Ht 64.0 in | Wt 170.6 lb

## 2023-10-24 DIAGNOSIS — I10 Essential (primary) hypertension: Secondary | ICD-10-CM | POA: Diagnosis not present

## 2023-10-24 DIAGNOSIS — E782 Mixed hyperlipidemia: Secondary | ICD-10-CM

## 2023-10-24 MED ORDER — DEXTROAMPHETAMINE SULFATE 10 MG PO TABS
20.0000 mg | ORAL_TABLET | Freq: Three times a day (TID) | ORAL | 0 refills | Status: DC
  Filled 2023-12-26: qty 180, 30d supply, fill #0

## 2023-10-24 MED ORDER — DEXTROAMPHETAMINE SULFATE 10 MG PO TABS
20.0000 mg | ORAL_TABLET | Freq: Three times a day (TID) | ORAL | 0 refills | Status: AC
  Filled 2023-10-26 (×2): qty 180, 30d supply, fill #0

## 2023-10-24 MED ORDER — DEXTROAMPHETAMINE SULFATE 10 MG PO TABS
20.0000 mg | ORAL_TABLET | Freq: Three times a day (TID) | ORAL | 0 refills | Status: AC
  Filled 2023-11-25: qty 180, 30d supply, fill #0

## 2023-10-24 NOTE — Assessment & Plan Note (Signed)
Improved.  Continue losartan 50 mg once daily.

## 2023-10-24 NOTE — Assessment & Plan Note (Signed)
Pt advised to:  Work on low cholesterol diet and exercise as tolerated

## 2023-10-24 NOTE — Progress Notes (Signed)
 Established Patient Office Visit  Subjective:      CC:  Chief Complaint  Patient presents with   Medical Management of Chronic Issues    6 week follow up - BP    HPI: Kimberly Rowland is a 43 y.o. female presenting on 10/24/2023 for Medical Management of Chronic Issues (6 week follow up - BP)  HTN: on losartan  50 mg once daily tolerating well. She doesn't check her blood pressure at home. Denies dizziness or light headedness. She is also taking hydrochlorothiazide  12.5 mg   HLD: walks often at work. Ldl 102   Prediabetes:A1c 6.0   Vitamin d  def: taking occasional vitamin D    04/07/22 mammogram pt just had breast augmentation and following with breast surgeon, has a f/u coming up.      Social history:  Relevant past medical, surgical, family and social history reviewed and updated as indicated. Interim medical history since our last visit reviewed.  Allergies and medications reviewed and updated.  DATA REVIEWED: CHART IN EPIC     ROS: Negative unless specifically indicated above in HPI.    Current Outpatient Medications:    Cholecalciferol  (VITAMIN D3) 50 MCG (2000 UT) TABS, Take 1 tablet (2,000 Units total) by mouth daily with a meal, Disp: 100 tablet, Rfl: 3   dextroamphetamine  (DEXTROSTAT ) 10 MG tablet, Take 2 tablets (20 mg total) by mouth 3 (three) times daily., Disp: 180 tablet, Rfl: 0   losartan  (COZAAR ) 50 MG tablet, Take 1 tablet (50 mg total) by mouth daily., Disp: 90 tablet, Rfl: 3      Objective:    BP 112/70 (BP Location: Left Arm, Patient Position: Sitting, Cuff Size: Normal)   Pulse 72   Temp 98.4 F (36.9 C) (Temporal)   Ht 5' 4 (1.626 m)   Wt 170 lb 9.6 oz (77.4 kg)   LMP 10/21/2023 (Exact Date)   SpO2 98%   Breastfeeding No   BMI 29.28 kg/m   Wt Readings from Last 3 Encounters:  10/24/23 170 lb 9.6 oz (77.4 kg)  09/05/23 173 lb 3.2 oz (78.6 kg)  08/31/23 171 lb 6.4 oz (77.7 kg)    Physical Exam Vitals reviewed.  Constitutional:       General: She is not in acute distress.    Appearance: Normal appearance. She is normal weight. She is not ill-appearing, toxic-appearing or diaphoretic.  HENT:     Head: Normocephalic.  Cardiovascular:     Rate and Rhythm: Normal rate.  Pulmonary:     Effort: Pulmonary effort is normal.     Breath sounds: Normal breath sounds.  Musculoskeletal:        General: Normal range of motion.  Neurological:     General: No focal deficit present.     Mental Status: She is alert and oriented to person, place, and time. Mental status is at baseline.  Psychiatric:        Mood and Affect: Mood normal.        Behavior: Behavior normal.        Thought Content: Thought content normal.        Judgment: Judgment normal.           Assessment & Plan:  Primary hypertension Assessment & Plan: Improved. Continue losartan  50 mg once daily.    Mixed hyperlipidemia Assessment & Plan: Pt advised to:  Work on low cholesterol diet and exercise as tolerated       Return in about 6 months (around 04/25/2024) for f/u blood pressure.  Ginger Patrick, MSN, APRN, FNP-C [facility]

## 2023-10-25 ENCOUNTER — Other Ambulatory Visit (HOSPITAL_COMMUNITY): Payer: Self-pay

## 2023-10-26 ENCOUNTER — Other Ambulatory Visit: Payer: Self-pay

## 2023-10-26 ENCOUNTER — Other Ambulatory Visit (HOSPITAL_COMMUNITY): Payer: Self-pay

## 2023-11-07 ENCOUNTER — Encounter: Payer: Self-pay | Admitting: *Deleted

## 2023-11-25 ENCOUNTER — Other Ambulatory Visit (HOSPITAL_COMMUNITY): Payer: Self-pay

## 2023-12-10 ENCOUNTER — Other Ambulatory Visit: Payer: Self-pay | Admitting: Registered Nurse

## 2023-12-10 DIAGNOSIS — R7303 Prediabetes: Secondary | ICD-10-CM

## 2023-12-26 ENCOUNTER — Other Ambulatory Visit (HOSPITAL_COMMUNITY): Payer: Self-pay

## 2024-01-24 ENCOUNTER — Other Ambulatory Visit (HOSPITAL_COMMUNITY): Payer: Self-pay

## 2024-01-24 MED ORDER — DEXTROAMPHETAMINE SULFATE 10 MG PO TABS
20.0000 mg | ORAL_TABLET | Freq: Three times a day (TID) | ORAL | 0 refills | Status: AC
  Filled 2024-01-24: qty 180, 30d supply, fill #0

## 2024-02-22 ENCOUNTER — Other Ambulatory Visit (HOSPITAL_COMMUNITY): Payer: Self-pay

## 2024-02-22 MED ORDER — DEXTROAMPHETAMINE SULFATE 10 MG PO TABS
20.0000 mg | ORAL_TABLET | Freq: Three times a day (TID) | ORAL | 0 refills | Status: AC
  Filled 2024-02-24: qty 180, 30d supply, fill #0

## 2024-02-24 ENCOUNTER — Other Ambulatory Visit: Payer: Self-pay

## 2024-02-24 ENCOUNTER — Other Ambulatory Visit (HOSPITAL_COMMUNITY): Payer: Self-pay

## 2024-03-05 ENCOUNTER — Encounter: Payer: Self-pay | Admitting: Registered Nurse

## 2024-03-20 ENCOUNTER — Other Ambulatory Visit (HOSPITAL_COMMUNITY): Payer: Self-pay

## 2024-03-20 MED ORDER — DEXTROAMPHETAMINE SULFATE 10 MG PO TABS
20.0000 mg | ORAL_TABLET | Freq: Three times a day (TID) | ORAL | 0 refills | Status: AC
  Filled 2024-04-24: qty 180, 30d supply, fill #0

## 2024-03-20 MED ORDER — DEXTROAMPHETAMINE SULFATE 10 MG PO TABS
20.0000 mg | ORAL_TABLET | Freq: Three times a day (TID) | ORAL | 0 refills | Status: AC
  Filled 2024-03-26: qty 180, 30d supply, fill #0

## 2024-03-20 MED ORDER — DEXTROAMPHETAMINE SULFATE 10 MG PO TABS: 20.0000 mg | ORAL_TABLET | Freq: Three times a day (TID) | ORAL | 0 refills | Status: AC

## 2024-03-26 ENCOUNTER — Other Ambulatory Visit (HOSPITAL_COMMUNITY): Payer: Self-pay

## 2024-03-28 ENCOUNTER — Ambulatory Visit: Admitting: Plastic Surgery

## 2024-04-10 ENCOUNTER — Encounter: Payer: Self-pay | Admitting: Registered Nurse

## 2024-04-10 ENCOUNTER — Telehealth: Payer: Self-pay | Admitting: Registered Nurse

## 2024-04-10 DIAGNOSIS — Z0000 Encounter for general adult medical examination without abnormal findings: Secondary | ICD-10-CM

## 2024-04-10 NOTE — Telephone Encounter (Signed)
 Epic reminder Hgba1c recheck overdue.  Be Well 2027 due Mar 2026 executive panel and Hgba1c.  Last labs Jun 2025 and Apr 2025  RN Karene notified to contact patient to schedule fasting lab draw and Be Well appt with patient.

## 2024-04-24 ENCOUNTER — Other Ambulatory Visit (HOSPITAL_COMMUNITY): Payer: Self-pay

## 2024-04-25 ENCOUNTER — Ambulatory Visit: Admitting: Family

## 2024-04-25 ENCOUNTER — Other Ambulatory Visit (HOSPITAL_COMMUNITY): Payer: Self-pay

## 2024-04-25 ENCOUNTER — Encounter: Payer: Self-pay | Admitting: Family

## 2024-04-25 DIAGNOSIS — I10 Essential (primary) hypertension: Secondary | ICD-10-CM

## 2024-04-25 DIAGNOSIS — E049 Nontoxic goiter, unspecified: Secondary | ICD-10-CM

## 2024-04-25 DIAGNOSIS — Z114 Encounter for screening for human immunodeficiency virus [HIV]: Secondary | ICD-10-CM

## 2024-04-25 DIAGNOSIS — Z202 Contact with and (suspected) exposure to infections with a predominantly sexual mode of transmission: Secondary | ICD-10-CM

## 2024-04-25 DIAGNOSIS — E782 Mixed hyperlipidemia: Secondary | ICD-10-CM

## 2024-04-25 DIAGNOSIS — Z1159 Encounter for screening for other viral diseases: Secondary | ICD-10-CM

## 2024-04-25 DIAGNOSIS — E559 Vitamin D deficiency, unspecified: Secondary | ICD-10-CM

## 2024-04-25 DIAGNOSIS — Z3A01 Less than 8 weeks gestation of pregnancy: Secondary | ICD-10-CM

## 2024-04-25 DIAGNOSIS — R7303 Prediabetes: Secondary | ICD-10-CM

## 2024-04-25 DIAGNOSIS — D500 Iron deficiency anemia secondary to blood loss (chronic): Secondary | ICD-10-CM

## 2024-04-25 DIAGNOSIS — N912 Amenorrhea, unspecified: Secondary | ICD-10-CM

## 2024-04-25 LAB — HEMOGLOBIN A1C: Hgb A1c MFr Bld: 5.8 % (ref 4.6–6.5)

## 2024-04-25 LAB — TSH: TSH: 3.19 u[IU]/mL (ref 0.35–5.50)

## 2024-04-25 LAB — LIPID PANEL
Cholesterol: 207 mg/dL — ABNORMAL HIGH (ref 28–200)
HDL: 60.8 mg/dL
LDL Cholesterol: 121 mg/dL — ABNORMAL HIGH (ref 10–99)
NonHDL: 146.33
Total CHOL/HDL Ratio: 3
Triglycerides: 127 mg/dL (ref 10.0–149.0)
VLDL: 25.4 mg/dL (ref 0.0–40.0)

## 2024-04-25 LAB — MICROALBUMIN / CREATININE URINE RATIO
Creatinine,U: 249.9 mg/dL
Microalb Creat Ratio: 15.3 mg/g (ref 0.0–30.0)
Microalb, Ur: 3.8 mg/dL — ABNORMAL HIGH (ref 0.7–1.9)

## 2024-04-25 LAB — CBC WITH DIFFERENTIAL/PLATELET
Basophils Absolute: 0.1 10*3/uL (ref 0.0–0.1)
Basophils Relative: 0.5 % (ref 0.0–3.0)
Eosinophils Absolute: 0 10*3/uL (ref 0.0–0.7)
Eosinophils Relative: 0.2 % (ref 0.0–5.0)
HCT: 38.8 % (ref 36.0–46.0)
Hemoglobin: 12.7 g/dL (ref 12.0–15.0)
Lymphocytes Relative: 15 % (ref 12.0–46.0)
Lymphs Abs: 1.8 10*3/uL (ref 0.7–4.0)
MCHC: 32.8 g/dL (ref 30.0–36.0)
MCV: 90 fl (ref 78.0–100.0)
Monocytes Absolute: 0.8 10*3/uL (ref 0.1–1.0)
Monocytes Relative: 6.7 % (ref 3.0–12.0)
Neutro Abs: 9.2 10*3/uL — ABNORMAL HIGH (ref 1.4–7.7)
Neutrophils Relative %: 77.6 % — ABNORMAL HIGH (ref 43.0–77.0)
Platelets: 376 10*3/uL (ref 150.0–400.0)
RBC: 4.31 Mil/uL (ref 3.87–5.11)
RDW: 14.9 % (ref 11.5–15.5)
WBC: 11.8 10*3/uL — ABNORMAL HIGH (ref 4.0–10.5)

## 2024-04-25 LAB — HCG, QUANTITATIVE, PREGNANCY: Quantitative HCG: 128091 m[IU]/mL

## 2024-04-25 LAB — MAGNESIUM: Magnesium: 2.1 mg/dL (ref 1.5–2.5)

## 2024-04-25 LAB — VITAMIN D 25 HYDROXY (VIT D DEFICIENCY, FRACTURES): VITD: 20.1 ng/mL — ABNORMAL LOW (ref 30.00–100.00)

## 2024-04-25 LAB — POCT URINE PREGNANCY: Preg Test, Ur: POSITIVE — AB

## 2024-04-25 MED ORDER — LABETALOL HCL 200 MG PO TABS
200.0000 mg | ORAL_TABLET | Freq: Two times a day (BID) | ORAL | 0 refills | Status: AC
  Filled 2024-04-25: qty 180, 90d supply, fill #0

## 2024-04-25 NOTE — Patient Instructions (Addendum)
" °  Gestational age [redacted] weeks & 3 days  Due date Sunday, Sept 20, 2026  Date of conception Sunday, Mar 18, 2024  ------------------------------------  Start prenatal vitamin Stop losartan   Start labetolol 200 mg twice daily  Talk to psychiatry about dextroamphetamine  as pregnancy category C  A referral was placed today for ob/gyn  Please let us  know if you have not heard back within 2 weeks about the referral.   ------------------------------------  Medications Safe to Use During Pregnancy Backache/Fever/Headache Extra Strength Tylenol   Allergies/Cold/Cough Allegra Benadryl  Claritin Chloraseptic throat spray Cough Drops Delsym Dextromethorphan Flonase Mucinex (Guaifenesin) Robitussin Tylenol  based products Saline nasal spray Vicks Vapor Rub Zyrtec   Constipation Citrucel Colace Dulcolax Magnesium  Oxide Metamucil Milk of Magnesia Miralax Senokot Smooth Move tea Hemorrhoids Anusol/Anusol HC Preparation H Tucks Pads Witch Hazel  Heartburn/Indigestion Antacids (Tums/Rolaids) Famotidine  Maalox Meclizine Mylanta Simethicone  Zantac   Nausea/Vomiting Preggie Pops Doxylamine Sea-Bands Vitamin B6 50mg  twice daily Unisom  to 1 tablet at bedtime  Skin Rashes Calamine lotion Hydrocortisone cream 1%  Yeast Infection Monistat (1, 3, 7) 7 Day Preferred    DO NOT TAKE ANY MEDICATIONS THAT CONTAIN ASPIRIN, IBUPROFEN , OR PHENYLEPHRINE  UNLESS RECOMMENDED BY YOUR PROVIDER If taking multiple medications, please check labels to avoid duplicating the same active ingredients.  Take medications as directed on the label. Please talk to your pharmacist or call our office at [phone] if you have questions.       "

## 2024-04-25 NOTE — Progress Notes (Signed)
 "  Established Patient Office Visit  Subjective:      CC:  Chief Complaint  Patient presents with   Hypertension    HPI: Kimberly Rowland is a 44 y.o. female presenting on 04/25/2024 for Hypertension .  Discussed the use of AI scribe software for clinical note transcription with the patient, who gave verbal consent to proceed.  History of Present Illness Kimberly Rowland is a 44 year old female with hypertension who presents for a blood pressure follow-up and concerns about a missed period.  She has not been monitoring her blood pressure at home and last took her losartan  last night.  She is experiencing anxiety due to a missed period of about a month. She has not taken a home pregnancy test and is concerned about the possibility of pregnancy, especially since she is not in a committed relationship. She describes her current relationship as a 'situationship' and mentions using protection. She has experienced cramps but no nausea or breast tenderness. She has a history of miscarriage five years ago.  She quit smoking after her surgery and does not use drugs or alcohol. She is not married and is not in a committed relationship. She has a child who she finds exhausting.  No headaches, chest pain, palpitations, weight gain, joint pain, nausea, or breast tenderness. Her periods have been regular and not overly heavy until this recent delay.         Social history:  Relevant past medical, surgical, family and social history reviewed and updated as indicated. Interim medical history since our last visit reviewed.  Allergies and medications reviewed and updated.  DATA REVIEWED: CHART IN EPIC     ROS: Negative unless specifically indicated above in HPI.   Current Medications[1]        Objective:        BP (!) 148/82 (BP Location: Left Arm, Patient Position: Sitting, Cuff Size: Large)   Pulse (!) 102   Temp 98.7 F (37.1 C) (Temporal)   Ht 5' 4 (1.626 m)   Wt 165  lb 3.2 oz (74.9 kg)   LMP 03/04/2024 (Approximate)   SpO2 99%   BMI 28.36 kg/m   Physical Exam   Wt Readings from Last 3 Encounters:  04/25/24 165 lb 3.2 oz (74.9 kg)  10/24/23 170 lb 9.6 oz (77.4 kg)  09/05/23 173 lb 3.2 oz (78.6 kg)    Physical Exam Vitals reviewed.  Constitutional:      General: She is not in acute distress.    Appearance: Normal appearance. She is normal weight. She is not ill-appearing, toxic-appearing or diaphoretic.  HENT:     Head: Normocephalic.  Cardiovascular:     Rate and Rhythm: Normal rate and regular rhythm.  Pulmonary:     Effort: Pulmonary effort is normal.  Musculoskeletal:        General: Normal range of motion.  Neurological:     General: No focal deficit present.     Mental Status: She is alert and oriented to person, place, and time. Mental status is at baseline.  Psychiatric:        Mood and Affect: Mood normal.        Behavior: Behavior normal.        Thought Content: Thought content normal.        Judgment: Judgment normal.          Results   Assessment & Plan:   Assessment and Plan Assessment & Plan  Amenorrhea One month with no typical  pregnancy symptoms such as nausea or breast tenderness. Regular menstrual cycles previously. Stress and anxiety may contribute. Primary concern is late menstrual period and possible pregnancy.    Primary hypertension Blood pressure slightly elevated. Losartan  50 mg daily. No recent home monitoring. No symptoms of headaches, chest pain, or palpitations. Anxiety may contribute to elevated readings. - Rechecked blood pressure before leaving the office - Continue losartan  50 mg daily  Assessment of STD exposure In a non-committal relationship with inconsistent condom use. No current symptoms of STDs. Discussed importance of STD testing due to relationship dynamics. - Offered STD testing  Positive pregnancy approximately 7 weeks  -Gestational age [redacted] weeks & 3 days Due date Sunday,  Sept 20, 2026 Date of conception Sunday, Mar 18, 2024 - Performed urine pregnancy test - positive pregnancy test in office, ordering hcg Quant -advised pt to start prenatal vitamin  -advised to stop losartan  start labetolol 200 mg twice daily -monitor blood pressure and heart rate  -referral to obgyn placed however pt states that she is questioning if she will keep the baby or [facility].       Return in about 2 weeks (around 05/09/2024) for f/u blood pressure.     Ginger Patrick, MSN, APRN, FNP-C [facility]        [1]  Current Outpatient Medications:    Cholecalciferol  (VITAMIN D3) 50 MCG (2000 UT) TABS, Take 1 tablet (2,000 Units total) by mouth daily with a meal, Disp: 100 tablet, Rfl: 3   dextroamphetamine  (DEXTROSTAT ) 10 MG tablet, Take 2 tablets (20 mg total) by mouth 3 (three) times daily., Disp: 180 tablet, Rfl: 0   dextroamphetamine  (DEXTROSTAT ) 10 MG tablet, Take 2 tablets (20 mg total) by mouth 3 (three) times daily., Disp: 180 tablet, Rfl: 0   dextroamphetamine  (DEXTROSTAT ) 10 MG tablet, Take 2 tablets (20 mg total) by mouth 3 (three) times daily., Disp: 180 tablet, Rfl: 0   dextroamphetamine  (DEXTROSTAT ) 10 MG tablet, Take 2 tablets (20 mg total) by mouth 3 (three) times daily., Disp: 180 tablet, Rfl: 0   dextroamphetamine  (DEXTROSTAT ) 10 MG tablet, Take 2 tablets (20 mg total) by mouth 3 (three) times daily., Disp: 180 tablet, Rfl: 0   [START ON 05/24/2024] dextroamphetamine  (DEXTROSTAT ) 10 MG tablet, Take 2 tablets (20 mg total) by mouth 3 (three) times daily. (06/13/24), Disp: 180 tablet, Rfl: 0   dextroamphetamine  (DEXTROSTAT ) 10 MG tablet, Take 2 tablets (20 mg total) by mouth 3 (three) times daily. (03/25/24), Disp: 180 tablet, Rfl: 0   dextroamphetamine  (DEXTROSTAT ) 10 MG tablet, Take 2 tablets (20 mg total) by mouth 3 (three) times daily. (04/24/24), Disp: 180 tablet, Rfl: 0   labetalol  (NORMODYNE ) 200 MG tablet, Take 1 tablet  (200 mg total) by mouth 2 (two) times daily., Disp: 180 tablet, Rfl: 0  "

## 2024-04-26 LAB — HIV ANTIBODY (ROUTINE TESTING W REFLEX)
HIV 1&2 Ab, 4th Generation: NONREACTIVE
HIV FINAL INTERPRETATION: NEGATIVE

## 2024-04-26 LAB — SYPHILIS: RPR W/REFLEX TO RPR TITER AND TREPONEMAL ANTIBODIES, TRADITIONAL SCREENING AND DIAGNOSIS ALGORITHM: RPR Ser Ql: NONREACTIVE

## 2024-04-26 LAB — HEPATITIS C ANTIBODY: Hepatitis C Ab: NONREACTIVE

## 2024-09-06 ENCOUNTER — Encounter: Admitting: Family
